# Patient Record
Sex: Male | Born: 1940 | Race: White | Hispanic: No | Smoking: Current every day smoker
Health system: Southern US, Community
[De-identification: ages and names within clinical notes are randomized; demographics above are authoritative.]

## PROBLEM LIST (undated history)

## (undated) DIAGNOSIS — E785 Hyperlipidemia, unspecified: Secondary | ICD-10-CM

## (undated) DIAGNOSIS — F1021 Alcohol dependence, in remission: Secondary | ICD-10-CM

## (undated) DIAGNOSIS — I639 Cerebral infarction, unspecified: Secondary | ICD-10-CM

## (undated) DIAGNOSIS — F32A Depression, unspecified: Secondary | ICD-10-CM

## (undated) DIAGNOSIS — N400 Enlarged prostate without lower urinary tract symptoms: Secondary | ICD-10-CM

## (undated) DIAGNOSIS — Z9289 Personal history of other medical treatment: Secondary | ICD-10-CM

## (undated) DIAGNOSIS — G119 Hereditary ataxia, unspecified: Secondary | ICD-10-CM

## (undated) DIAGNOSIS — R011 Cardiac murmur, unspecified: Secondary | ICD-10-CM

## (undated) DIAGNOSIS — M79671 Pain in right foot: Secondary | ICD-10-CM

## (undated) DIAGNOSIS — R269 Unspecified abnormalities of gait and mobility: Secondary | ICD-10-CM

## (undated) DIAGNOSIS — E1140 Type 2 diabetes mellitus with diabetic neuropathy, unspecified: Secondary | ICD-10-CM

## (undated) DIAGNOSIS — W19XXXA Unspecified fall, initial encounter: Secondary | ICD-10-CM

## (undated) DIAGNOSIS — I679 Cerebrovascular disease, unspecified: Secondary | ICD-10-CM

## (undated) DIAGNOSIS — F0390 Unspecified dementia without behavioral disturbance: Secondary | ICD-10-CM

## (undated) DIAGNOSIS — R32 Unspecified urinary incontinence: Secondary | ICD-10-CM

## (undated) DIAGNOSIS — F319 Bipolar disorder, unspecified: Secondary | ICD-10-CM

## (undated) DIAGNOSIS — Z8669 Personal history of other diseases of the nervous system and sense organs: Secondary | ICD-10-CM

## (undated) DIAGNOSIS — I10 Essential (primary) hypertension: Secondary | ICD-10-CM

## (undated) DIAGNOSIS — F329 Major depressive disorder, single episode, unspecified: Secondary | ICD-10-CM

## (undated) DIAGNOSIS — F1011 Alcohol abuse, in remission: Secondary | ICD-10-CM

## (undated) DIAGNOSIS — R296 Repeated falls: Secondary | ICD-10-CM

## (undated) DIAGNOSIS — F17200 Nicotine dependence, unspecified, uncomplicated: Secondary | ICD-10-CM

## (undated) DIAGNOSIS — R9089 Other abnormal findings on diagnostic imaging of central nervous system: Secondary | ICD-10-CM

## (undated) DIAGNOSIS — M79672 Pain in left foot: Secondary | ICD-10-CM

## (undated) DIAGNOSIS — R413 Other amnesia: Secondary | ICD-10-CM

## (undated) DIAGNOSIS — D649 Anemia, unspecified: Secondary | ICD-10-CM

## (undated) HISTORY — PX: HERNIA REPAIR: SHX51

## (undated) HISTORY — DX: Benign prostatic hyperplasia without lower urinary tract symptoms: N40.0

## (undated) HISTORY — DX: Cerebrovascular disease, unspecified: I67.9

## (undated) HISTORY — DX: Pain in left foot: M79.672

## (undated) HISTORY — DX: Unspecified abnormalities of gait and mobility: R26.9

## (undated) HISTORY — DX: Unspecified dementia without behavioral disturbance: F03.90

## (undated) HISTORY — DX: Alcohol dependence, in remission: F10.21

## (undated) HISTORY — DX: Other abnormal findings on diagnostic imaging of central nervous system: R90.89

## (undated) HISTORY — DX: Personal history of other medical treatment: Z92.89

## (undated) HISTORY — DX: Anemia, unspecified: D64.9

## (undated) HISTORY — DX: Personal history of other diseases of the nervous system and sense organs: Z86.69

## (undated) HISTORY — DX: Major depressive disorder, single episode, unspecified: F32.9

## (undated) HISTORY — DX: Hereditary ataxia, unspecified: G11.9

## (undated) HISTORY — DX: Unspecified urinary incontinence: R32

## (undated) HISTORY — DX: Alcohol abuse, in remission: F10.11

## (undated) HISTORY — DX: Depression, unspecified: F32.A

## (undated) HISTORY — DX: Cerebral infarction, unspecified: I63.9

## (undated) HISTORY — DX: Essential (primary) hypertension: I10

## (undated) HISTORY — DX: Hyperlipidemia, unspecified: E78.5

## (undated) HISTORY — DX: Cardiac murmur, unspecified: R01.1

## (undated) HISTORY — DX: Other amnesia: R41.3

## (undated) HISTORY — DX: Type 2 diabetes mellitus with diabetic neuropathy, unspecified: E11.40

## (undated) HISTORY — DX: Pain in right foot: M79.671

## (undated) HISTORY — DX: Repeated falls: R29.6

## (undated) HISTORY — DX: Nicotine dependence, unspecified, uncomplicated: F17.200

## (undated) HISTORY — PX: TONSILLECTOMY: SUR1361

## (undated) HISTORY — DX: Unspecified fall, initial encounter: W19.XXXA

## (undated) HISTORY — DX: Bipolar disorder, unspecified: F31.9

---

## 2008-05-31 ENCOUNTER — Inpatient Hospital Stay (HOSPITAL_COMMUNITY): Admission: EM | Admit: 2008-05-31 | Discharge: 2008-06-06 | Payer: Self-pay | Admitting: Emergency Medicine

## 2008-05-31 ENCOUNTER — Ambulatory Visit: Payer: Self-pay | Admitting: Cardiology

## 2008-06-03 ENCOUNTER — Encounter (INDEPENDENT_AMBULATORY_CARE_PROVIDER_SITE_OTHER): Payer: Self-pay | Admitting: Internal Medicine

## 2008-06-03 ENCOUNTER — Ambulatory Visit: Payer: Self-pay | Admitting: *Deleted

## 2008-06-03 DIAGNOSIS — Z9289 Personal history of other medical treatment: Secondary | ICD-10-CM

## 2008-06-03 HISTORY — DX: Personal history of other medical treatment: Z92.89

## 2008-06-04 ENCOUNTER — Ambulatory Visit: Payer: Self-pay | Admitting: Physical Medicine & Rehabilitation

## 2008-06-06 ENCOUNTER — Observation Stay (HOSPITAL_COMMUNITY): Admission: EM | Admit: 2008-06-06 | Discharge: 2008-06-10 | Payer: Self-pay | Admitting: Emergency Medicine

## 2011-04-09 ENCOUNTER — Ambulatory Visit (INDEPENDENT_AMBULATORY_CARE_PROVIDER_SITE_OTHER): Payer: Medicare Other | Admitting: Family Medicine

## 2011-04-09 DIAGNOSIS — R3915 Urgency of urination: Secondary | ICD-10-CM

## 2011-04-09 DIAGNOSIS — N400 Enlarged prostate without lower urinary tract symptoms: Secondary | ICD-10-CM

## 2011-04-09 DIAGNOSIS — R32 Unspecified urinary incontinence: Secondary | ICD-10-CM

## 2011-04-09 DIAGNOSIS — E119 Type 2 diabetes mellitus without complications: Secondary | ICD-10-CM

## 2011-05-04 NOTE — Discharge Summary (Signed)
NAMEAREON, COCUZZA NO.:  [id number]   MEDICAL RECORD NO.:  [id number]          PATIENT TYPE:  INP   LOCATION:  3028                         FACILITY:  [facility]   PHYSICIAN:  Elliot Cousin, M.D.    DATE OF BIRTH:  [birth year]   DATE OF ADMISSION:  05/31/2008  DATE OF DISCHARGE:  06/06/2008                               DISCHARGE SUMMARY   ADDENDUM:  Please see the previous discharge summaries dictated by Dr. Hillery Aldo and Dr. Lonia Blood.   AMENDED/ADDENDUM DISCHARGE DIAGNOSES:  1. Homelessness.  Over the past 48 hours, the major issue has been      disposition.  Apparently, the patient has adamantly refused      assisted living facility and/or skilled nursing facility placement.      The patient's sister was approached with the possibility that he      could come home with her.  She declined and refused to accept him      into her home.  Therefore, the clinical social worker was consulted      for further recommendations.  In the interim, Dr. Jeanie Sewer,      psychiatrist, was consulted to assess the patient's capacity to      make his own decisions.  Dr. Jeanie Sewer evaluated the patient today,      and he clearly stated that the patient had capacity to make an      informed decision.  The patient is currently open and receptive to      being discharged to a homeless shelter.  Therefore, the patient      will be discharged to [facility] today after the clinical social      worker confirmed that the [facility] has beds available.  The      patient is currently alert and oriented and stable.  2. ANXIETY DISORDER, not otherwise specified/rule out delirium, not      otherwise specified.  As indicated above, psychiatrist Dr.      Jeanie Sewer was consulted to assess the patient for his ability to      make informed decisions.  The patient has a history of depression      and anxiety.  He had been previously treated with Seroquel and      amitriptyline.   Several days ago, the Seroquel and amitriptyline      were discontinued.  Dr. Jeanie Sewer agreed with keeping the patient      off of therapy with Elavil and Seroquel.  He felt that the      patient's long term memory may improve off of the amitriptyline.      He recommended that the patient follow up with psychiatry and/or      neurology at the [facility] if needed.  Also if needed, the patient could      be referred to the outpatient clinics at [facility],      [facility], or [facility].  From a psychiatric      standpoint, the patient is currently stable and has no acute needs.   DISCHARGE  MEDICATIONS:  1. Aspirin 325 mg daily.  2. Plavix 75 mg daily.  3. Metformin 1000 mg b.i.d.  4. Lisinopril 2.5 mg daily.  5. Simvastatin 40 mg daily.  6. Amlodipine 10 mg daily.  7. Omeprazole 20 mg daily.  8. Multivitamin once daily.  9. Galantamine 4 mg b.i.d.  10.Stop amitriptyline and Seroquel.   DISCHARGE DISPOSITION:  The patient was advised to follow up with his  regular physician at the [facility].  The patient was also advised to  call Dr. Bud Face for a follow-up appointment for reassessment of  the carotid artery stenosis.  The patient voiced understanding.   CONSULTATIONS:  Antonietta Breach, MD.      Elliot Cousin, M.D.  Electronically Signed     DF/MEDQ  D:  06/06/2008  T:  06/06/2008  Job:  [id number]

## 2011-05-04 NOTE — H&P (Signed)
[name], Austin NO.:  [name]   MEDICAL RECORD NO.:  [name]          PATIENT TYPE:  OBV   LOCATION:  5511                         FACILITY:  [facility]   PHYSICIAN:  Della Goo, M.D. DATE OF BIRTH:  04/06/1941   DATE OF ADMISSION:  06/06/2008  DATE OF DISCHARGE:                              HISTORY & PHYSICAL   NOTE:  This is a readmission.  The patient was discharged on June 06, 2008 and readmitted June 06, 2008.  Please refer to the previous history  and physical.  The patient was previously admitted on May 31, 2008 for  left-sided weakness and was evaluated.  He also had confusion at that  time.  During that hospitalization the patient was evaluated, had a  psychiatric evaluation and medications, which were adjusted.  He was  deemed competent to make his own decisions; however, there were issues  with placement of this patient.  The patient initially declined to be  placed in an assisted living facility and declined to be placed in a  homeless shelter; and, finally agreed to be discharged to the [facility].   The patient receives his medical care at the [facility].  He  also had previously been living with his sister, who lives in the area;  however, she refused to take him back into her home.  Apparently there  was a misunderstanding with the patient's placement at the [facility]  because at the [facility] had no beds available; and, the patient  returned to the emergency department.  The patient is without any  complaints.   PAST MEDICAL HISTORY:  The patient has a past medical history  significant for;  1. Hypertension.  2. Diabetes mellitus.  3. Gastroesophageal reflux disease.  4. Dyslipidemia.  5. Dementia.  6. Anxiety and depression.   MEDICATIONS:  The patient's medications include:  1. Lisinopril 2.5 mg 1 by mouth every day.  2. Aspirin 81 mg 1 by mouth every day.  3. Amlodipine 10 mg 1 by mouth every day.  4.  Metformin 1,000 mg 1 by mouth twice a day.  5. Galantine 4 mg 1 by mouth twice a day.  6. Thiamine 100 mg 1 by mouth every day.  7. Aspirin 325 mg 1 by mouth every day.  8. Plavix 75 mg 1 by mouth every day.  9. Omeprazole 20 mg 1 by mouth every day.  10.Multivitamin 1 by mouth every day.  11.The amitriptyline and Seroquel were discontinued per psychiatry.      These medications were discontinued secondary to problems with the      patient's long-term memory.   ALLERGIES:  No known drug allergies.   SOCIAL HISTORY:  The patient is a smoker and smokes a half-pack per day  and he has a past history of alcohol abuse.  He denies any IV drug  abuse.   FAMILY HISTORY:  The family history is positive for diabetes and  hypertension.   REVIEW OF SYSTEMS:  On review of systems the pertinents mentioned above.   PHYSICAL EXAMINATION:  GENERAL APPEARANCE:  This  is a 70 year old well-  nourished, well-developed male in no acute distress.  VITAL SIGNS:  His vital signs are temperature 98.2, blood pressure  129/71, heart rate 65, respirations 20, and his O2 saturation is 96% on  room air.  HEENT: Normocephalic and atraumatic.  Pupils are equally round and react  to light.  Extraocular muscles are intact.  Oropharynx is  clear.  NECK:  The neck is supple with full range of motion.  No thyromegaly,  adenopathy or jugular venous distention.  HEART:  Cardiovascular reveals a regular rate and rhythm.  No murmurs,  gallops or rubs.  LUNGS:  The lungs are clear to auscultation bilaterally.  ABDOMEN:  The abdomen has positive bowel sounds.  It is soft, nontender  and nondistended.  EXTREMITIES:  The extremities are without cyanosis, clubbing or edema.  NEUROLOGIC:  The neurological examination is nonfocal.   ASSESSMENT:  This is a 70 year old male being readmitted secondary to:  1. Dementia.  2. Homelessness.  3. Type 2 diabetes mellitus.  4. Hypertension.  5. Hyperlipidemia.   PLAN:  1. The  patient will be admitted for 23-hour observation.  2. He will resume his previous medications.  3. He will be monitored for neurologic changes.  4. Case management will be consulted for placement options.  5. DVT and GI prophylaxes have been ordered.      Della Goo, M.D.  Electronically Signed     HJ/MEDQ  D:  06/07/2008  T:  06/07/2008  Job:  [id number]

## 2011-05-04 NOTE — Discharge Summary (Signed)
[name], [name] NO.:  [id number]   MEDICAL RECORD NO.:  [id number]          PATIENT TYPE:  INP   LOCATION:  3028                         FACILITY:  [facility]   PHYSICIAN:  Hillery Aldo, M.D.   DATE OF BIRTH:  09/19/1941   DATE OF ADMISSION:  05/31/2008  DATE OF DISCHARGE:  06/04/2008                               DISCHARGE SUMMARY   ADDENDUM   PRIMARY CARE PHYSICIAN:  [place].  Unassigned.   For complete list of the discharge diagnoses, consultations, procedures,  diagnostic studies, and hospital course through June 03, 2008, please  see the previously dictated discharge summary done by Dr. Sharon Seller.   FINAL DISCHARGE DIAGNOSES:  1. Acute right internal capsule cerebrovascular accident.  2. Right distal internal carotid artery stenosis.   DISCHARGE MEDICATIONS:  1. Lisinopril 2.5 mg daily.  2. Aspirin 325 mg daily.  3. Amitriptyline 50 mg at bedtime.  4. Simvastatin 40 mg daily.  5. Amlodipine 10 mg daily.  6. Omeprazole 20 mg daily.  7. Metformin 1000 mg b.i.d.  8. Galantamine 4 mg b.i.d.  9. Plavix 75 mg daily.  10.Seroquel at previously prescribed dosage.   REMAINDER OF HOSPITAL COURSE:  The patient has remained medically stable  and is adamantly refusing placement at a assisted or skilled nursing  facility.  He is not a candidate for inpatient comprehensive  rehabilitation.  The most optimal situation for him would be 24 hours  supervision at home, but it is unclear if he will have such supervision.  Nevertheless, the patient is refusing placement at this time.  We will  send him home with home health physical therapy, occupational therapy,  and durable medical equipment including a rolling walker.  He plans to  return to his sister's home.  He should follow up with his primary care  physician in 1 week's time.  He should follow up with Dr. Jake [name] of  vascular surgery as an outpatient.  He was evaluated in the hospital and  recommendations were for intensive medical therapy and for followup in  the outpatient setting.      Hillery Aldo, M.D.  Electronically Signed    CR/MEDQ  D:  06/04/2008  T:  06/05/2008  Job:  [id number]

## 2011-05-04 NOTE — Consult Note (Signed)
NAMEGAD, AYMOND NO.:  [id number]   MEDICAL RECORD NO.:  [id number]          PATIENT TYPE:  INP   LOCATION:  3028                         FACILITY:  [facility]   PHYSICIAN:  Antonietta Breach, M.D.  DATE OF BIRTH:  1941/11/09   DATE OF CONSULTATION:  06/06/2008  DATE OF DISCHARGE:                                 CONSULTATION   REQUESTING PHYSICIAN:  [facility].   REASON FOR CONSULTATION:  Mental status changes, assess capacity.   HISTORY OF PRESENT ILLNESS:  Mr. Hansen is a 70 year old male  admitted to the [facility] on May 31, 2008 due to a left-sided  CVA.   The patient was noted to have a history of depression and anxiety in  review of the past medical record.  However, at this time the patient  has mild decreased energy but no other depressive symptoms.  He does  have some slight worry, but it does appear to be realistic regarding his  rehabilitation anticipation.   Mr. Attia was on Elavil 50 mg nightly prior to admission.  However,  this appears to have been an anti-pain dosage.   There was some mention of dementia in the past medical record as well.  However, the patient at this time does not meet any criteria for  dementia.  It may have been that he was having Elavil side effects.   Mr. Zeringue is socially appropriate and cooperative with staff.  He is  well aware of his general medical problems and how he came to be in the  hospital.  He is also aware of his self-care needs as well as typical  emergency signs and symptoms that would require him to call EMS.   He describes normal interests as well as constructive hope for the  future.  He states that he has been interested in fishing but has not  gone in a number of years.  He enjoys music.  He is not having any  hallucinations or delusions.  He has no thoughts of harming himself or  others.  He is socially appropriate and cooperative.   PAST PSYCHIATRIC HISTORY:  As  mentioned, there was a note in the past  medical record of depression and anxiety.  However, the patient denies  depression.  He was on Elavil 50 mg nightly in the past.  Also, he was  on Seroquel 100 mg nightly.  This may have been for a sundowning.   He does have a history in the past medical record of anxiety, noted.   FAMILY PSYCHIATRIC HISTORY:  None known.   SOCIAL HISTORY:  The patient has been living with his sister.  He does  not use alcohol.  He does not use illegal drugs.  He is retired and is a  Cytogeneticist.   PAST MEDICAL HISTORY:  Diabetes mellitus, hypertension, gastroesophageal  reflux disease, acute CVA.   No known drug allergies.   MEDICATIONS:  The MAR is reviewed.  The patient is not on any  psychotropic agents.  The Elavil has been discontinued.   LABORATORY DATA:  Sodium 133, BUN 11, creatinine  0.95, SGOT 18, SGPT 12,  WBC 7.9, hemoglobin 10.7, platelet count 182,000.   REVIEW OF SYSTEMS:  Constitutional, head, eyes, ears, nose, throat,  mouth, neurologic, psychiatric cardiovascular, respiratory,  gastrointestinal, genitourinary, skin, musculoskeletal, hematologic  lymphatic, endocrine, metabolic all unremarkable.   EXAMINATION:  VITAL SIGNS:  Temperature 97.8, pulse 67, respiratory rate  18, blood pressure 111/71, O2 saturation on room air 93%.  GENERAL APPEARANCE:  Mr. Vanallen is an elderly male lying in his bed  in a supine position.  He does appear to be approximately 10 years  younger than his chronologic age.  He has no abnormal involuntary movements.  MENTAL STATUS EXAM:  Mr. Woodford is alert.  His eye contact is good.  His attention span is within normal limits.  His concentration is within  normal limits.  Memory function is excellent with 3/3 immediate and 3/3  on recall.  His fund of knowledge and intelligence are within normal  limits.  His speech involves normal rate and prosody without dysarthria.  His affect is slightly anxious at baseline  but with a broad appropriate  response.  His mood is within normal limits.  Thought process logical,  coherent, goal-directed.  No looseness of associations or thought  content.  No thoughts of harming himself.  No thoughts of harming  others.  No delusions.  No hallucinations.  Insight is good.  The  patient clearly understands what brought him to the hospital.  Judgment  is intact.   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  Rule out 293.00 delirium not otherwise specified.  The patient may have  had partial symptoms of decreased memory and some cognitive disability.  He may have even had some sundowning.  However, now that the Elavil has  been discontinued, noting that it has significant anticholinergic side  effects, the patient's long-term memory and cognitive status does need  to be monitored to confirm the psychotropic regimen that will be  required over the long-term.  AXIS II:  None.  AXIS III:  See past medical history.  AXIS IV:  General medical.  AXIS V:  55.   Mr. Arnone can make a consistent choice.  He can differentiate  between his options and their associated risks versus benefits.  He does  appreciate his general medical problems and their potential morbidity  and mortality risks.  He also can reason well.   Please see the AXIS I discussion above.  Mr. Menter   does have the capacity for informed consent and self care.   RECOMMENDATIONS:  1. Concur with the discontinuation of Elavil.  Please see the      discussion above.  The patient does not need any acute      psychotropics.  2. However, would have the patient follow up with outpatient      psychiatry or neurology for monitoring his      memory ability.  Now that he is off the Elavil, he has a greater      chance of long-term memory function.  3. Psychiatric followup can be arranged if any depression or      significant anxiety emerges.  Outpatient clinics are available  at      [facility], [facility], or [facility].      Antonietta Breach, M.D.  Electronically Signed     JW/MEDQ  D:  06/06/2008  T:  06/06/2008  Job:  [id number]

## 2011-05-04 NOTE — Discharge Summary (Signed)
NAMEDUFF, POZZI NO.:  [id number]   MEDICAL RECORD NO.:  [id number]          PATIENT TYPE:  INP   LOCATION:  3028                         FACILITY:  [facility]   PHYSICIAN:  Lonia Blood, M.D.DATE OF BIRTH:  March 20, 1941   DATE OF ADMISSION:  05/31/2008  DATE OF DISCHARGE:  06/03/2008                               DISCHARGE SUMMARY   PRIMARY CARE PHYSICIAN:  [place].  Unassigned.   DISCHARGE DIAGNOSES:  1. Acute right internal capsule CVA.      a.     CT scan suggesting small vessel disease.      b.     MRA unremarkable.      c.     Carotid Dopplers suggesting 40-60% distal ICA stenosis.      d.     Vascular surgery consultation pending for consideration of       carotid endarterectomy  2. Hopeful for inpatient rehabilitation.  3. Transthoracic echocardiogram pending.  4. Uncontrolled diabetes mellitus, currently titrating medications.  5. Hypertension.  Medication adjustment ongoing  6. Occasional PVCs.  Echo pending.  7. Tobacco abuse.  Tobacco cessation consultation pending.  8. Hyperlipidemia.  Successfully treated with current therapy.  9. Normocytic anemia.  Hemoglobin stable at approximate 10.5 with no      evidence of severe iron deficiency, but outpatient screening      colonoscopy recommended.  10.Mild dementia.  Full medication initiated.   DISCHARGE MEDICATIONS:  The exact regimen that will be used will be  determined at the actual time of discharge.   CONSULTATIONS:  1. Vascular surgery consultation currently pending.  2. Inpatient rehab consultation currently pending.   PROCEDURES:  1. CT scan of the head May 31, 2008, atrophy with small-vessel      chronic ischemic change of the deep cerebral white matter.  No      acute intracranial abnormalities.  2. MRI of the brain on June 01, 2008.  Small acute infarct, posterior      limb internal capsule on the right.  Generalized atrophy and      chronic ischemic change.  3. MRA of the  head June 13,009, negative.  4. Bilateral carotid Dopplers, 40-60% right ICA distal stenosis.      Vertebral flow antegrade.  Left no hemodynamically significant ICA      stenosis.  5. Transthoracic echocardiogram, currently pending   FOLLOW UP:  To be determined at actual time of discharge.   HOSPITAL COURSE:  Mr. Keyron Pokorski is a very pleasant 70 year old  gentleman who received most of his care at the [facility].  He has no  local physician as a result.  He presented to the hospital on May 31, 2008, with acute onset of left-sided weakness.  This was associated with  gait instability.  Upon presentation to the hospital, there was concern  the patient had likely suffered a right brain stroke.  CT scan was  carried out and failed to reveal any evidence of a hemorrhagic stroke or  intracranial mass.  The patient was admitted to the acute units.  Full  evaluation was  initiated.  The patient passed a stroke swallow screen  was able to tolerate a regular diabetic diet without difficulty.  MRI  and MRA of the head were then obtained.  MRA was unrevealing.  The MRI  did in fact confirm the presence of a posterior right internal capsule  acute CVA.  This was in agreement with the patient's clinical symptoms  of left upper extremity weakness and left lower extremity weakness and  generalized clumsiness of use of these extremities.  Transthoracic  echocardiogram was accomplished and is currently pending.  Bilateral  carotid Dopplers were accomplished and today have revealed a 40-60%  right-sided ICA stenosis.  In that this should be considered a  symptomatic ICA stenosis.  There is possible benefit to be gleaned from  carotid endarterectomy.  As a result, the vascular surgery will be asked  to evaluate the patient and consider proceeding with this procedure at  their discretion.  Additionally, inpatient rehab is being consulted to  attempt to provide the patient with balance retraining  and increase in  his strength.  It is felt that his rehab potential is not quite high.   Secondary stroke risk prevention was carried out with strict control of  blood pressure and diabetes.  The patient's blood pressure is currently  actually somewhat overly controlled on mild blood pressure medicine and  treatment alone, and therefore we are backing off to assure that he has  adequate cerebral perfusion.  CBG, however, had proven to be poorly  controlled, and ongoing titration of medications is currently being  carried out.  The patient does have a history of hyperlipidemia, but his  LDL is 40 on his current Zocor therapy, and this was therefore  continued.  Of note, the patient was also found to be suffering with a  normocytic anemia.  Iron studies were not suggestive of severe iron  deficiency.  Stool guaiacs have been ordered but are still pending.  At  the present time, inpatient evaluation is not felt to be necessary, but  consideration should be given to outpatient routine screening  colonoscopy simply based upon the patient's age.   At the present time, the patient is clinically stable.  We are  continuing with rehab and hoping for an ultimate stay within inpatient  rehab unit.  Vascular Surgery is also being asked to evaluate the  patient for possible carotid endarterectomy.      Lonia Blood, M.D.  Electronically Signed     JTM/MEDQ  D:  06/03/2008  T:  06/03/2008  Job:  [id number]

## 2011-05-04 NOTE — H&P (Signed)
NAMESOSTENES, [name] NO.:  [id number]   MEDICAL RECORD NO.:  [id number]          PATIENT TYPE:  INP   LOCATION:  3039                         FACILITY:  [facility]   PHYSICIAN:  Lonia Blood, M.D.      DATE OF BIRTH:  12-01-1941   DATE OF ADMISSION:  05/31/2008  DATE OF DISCHARGE:                              HISTORY & PHYSICAL   PRIMARY CARE PHYSICIAN:  The patient goes to [facility], hence he is on  the [place].   PRESENTING COMPLAINT:  Left-sided weakness.   HISTORY OF PRESENT ILLNESS:  The patient is a 70 year old gentleman with  history of diabetes and hypertension among other things who apparently  woke up today around 1 o'clock with left-sided weakness.  He was having  numbness in his upper extremities and lower extremities.  He was unable  to put enough weight on it due to weakness.  Denied any fever, nausea,  vomiting, or diarrhea.  The patient was brought in by his sister for  further management.  He has been taking his medications consistently  without any problems.  He was just seen at [facility] [place] 2  days ago, where he had a CT scan of the abdomen and pelvis and at that  time, came back as normal.  He denied any other focal weakness.  No  problem with his speech.  No problem with his swallowing.   PAST MEDICAL HISTORY:  His past medical history is significant for  diabetes, hypertension, gastroesophageal reflux disease, dyslipidemia,  dementia, anxiety, and depression.   ALLERGIES:  He has no known drug allergies.   MEDICATIONS:  Include lisinopril 2.5 mg daily, aspirin 81 mg daily,  amitriptyline 50 mg daily at night, simvastatin 40 mg daily, amlodipine  10 mg daily, omeprazole 20 mg daily, metformin 500 mg b.i.d., Seroquel  100 mg daily, Galantamine oral 4 mg 2 tablets started about 2 days ago,  vitamin B1 as needed, and vitamin D also as needed.   SOCIAL HISTORY:  The patient currently lives with his sister.  He smokes  about half a  pack per day.  He has not drank alcohol in a year and half.  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   FAMILY HISTORY:  Significant for diabetes and hypertension.   REVIEW OF SYSTEMS:  A 12-point review of systems is negative except by  HPI.   PHYSICAL EXAMINATION:  VITAL SIGNS:  Temperature is 98.3, blood pressure  135/75, pulse 77, respiratory rate 18, and sat 97% on room air.  GENERAL:  He is awake, alert, oriented man in no acute distress.  Able  to carry on conversation effectively.  HEENT:  PERRL.  EOMI.  NECK:  Supple.  No JVD.  No lymphadenopathy.  RESPIRATORY:  He has good air entry bilaterally.  No wheezes or rales.  CARDIOVASCULAR:  The patient has S1 and S2 normal.  ABDOMEN:  Soft and nontender with positive bowel sounds.  EXTREMITIES:  No edema, cyanosis, or clubbing.  NEURO:  Cranial nerves II through XII seem to be intact.  His power  is  5/5 both upper and lower extremities respectively bilaterally.  Reflex  is 2+.   LABORATORY DATA:  White count is 10.5, hemoglobin 11.4, platelet count  219 with MCV of 87.8 and normal differentials.  Cardiac enzymes  essentially negative.  PT 14.1 and INR 1.1.  Sodium 137, potassium 4.1,  chloride 106, CO2 of 22, glucose 150, BUN 13, creatinine 1.22, calcium  9.8, total protein 7.3, and albumin 4.1.   Chest x-ray showed no active cardiopulmonary disease.  Head CT without  contrast showed atrophy with small vessel chronic ischemic changes.  No  acute intracranial abnormalities.   ASSESSMENT:  This is a 70 year old gentleman presenting with left-sided  weakness most likely cerebrovascular accident.  The patient has all the  risk factors for cerebrovascular accident.  His head CT is negative, so  this may be embolic in nature.   PLAN:  1. Acute CVA.  We will admit the patient to a monitored bed especially      because his EKG showed some abnormal findings including sinus      rhythm with a rate of 85 by multiple premature  atrial complexes and      bifascicular block.  With these, we will check serial cardiac      enzymes, carotid Dopplers, 2D echocardiogram,  B12, RPR level,      check homocysteine level, and fasting lipid panel etc.  If we find      any significant risk factor, we would tackle it and modify it.  At      this point, the patient has came in more than 3 hours after the      onset of his symptoms, so he is outside the window for TPA.  We,      however, gave him some aspirin rectally, keep him n.p.o. until he      passes swallow evaluation.  2. Diabetes.  I will put the patient on sliding scale insulin while he      is n.p.o. at this point and if needed, we will use long-acting      insulin once he starts eating.  3. Hypertension.  Blood pressure is okay at this point and will not      treat it.  4. Tobacco abuse.  I will put him on nicotine patch as needed.  5. Dyslipidemia.  The patient has been taking statins at home.  We      will check fasting lipid panel at this point.  6. GERD.  I will continue PPI, remain IV at this point.  Further      treatment will depend on the patient's response to these initial      measures in the hospital.      Lonia Blood, M.D.  Electronically Signed     LG/MEDQ  D:  05/31/2008  T:  06/01/2008  Job:  [id number]

## 2011-05-07 NOTE — Discharge Summary (Signed)
NAMECLEMENS, LACHMAN NO.:  [id number]   MEDICAL RECORD NO.:  [id number]          PATIENT TYPE:  OBV   LOCATION:  5511                         FACILITY:  [facility]   PHYSICIAN:  Isidor Holts, M.D.  DATE OF BIRTH:  1941/03/14   DATE OF ADMISSION:  06/06/2008  DATE OF DISCHARGE:  06/10/2008                               DISCHARGE SUMMARY   ADDENDUM:  For details of discharge diagnoses, consultation, procedures  and detailed clinical course, refer to interim discharge summaries  dictated June 03, 2008 by Dr. Jetty Duhamel, June 04, 2008 by Dr.  Hillery Aldo, and June 06, 2008 by Dr. Elliot Cousin.  The patient was  discharged on June 06, 2008 in the first instance, but re presented  later the same day.  For details of that readmission, refer to H&P notes  of June 07, 2008 dictated by Dr. Della Goo.   In brief summary, this patient was due to return to [facility], but  there were no beds available, and, therefore, the patient was returned  to the emergency department, and readmitted. For the period from June 07, 2008 to June 10, 2008,  there were no changes in the patient's  clinical condition.  Hypertension remained controlled, as did type 2  diabetes mellitus.  There were no problems referable to dementia and no  acute problems arose.  He was subsequently discharged to skilled nursing  facility, i.e., [facility] on June 10, 2008.   DISCHARGE MEDICATIONS:  These remained unchanged from that of discharge  summary of June 06, 2008 dictated by Dr. Elliot Cousin.      Isidor Holts, M.D.  Electronically Signed     CO/MEDQ  D:  06/24/2008  T:  06/24/2008  Job:  [id number]

## 2011-06-01 DIAGNOSIS — Z9289 Personal history of other medical treatment: Secondary | ICD-10-CM

## 2011-06-01 HISTORY — DX: Personal history of other medical treatment: Z92.89

## 2011-09-16 LAB — COMPREHENSIVE METABOLIC PANEL
ALT: 12
AST: 18
AST: 20
Albumin: 4.1
Albumin: 3.6
Albumin: 3.9
Alkaline Phosphatase: 65
BUN: 13
BUN: 19
CO2: 24
Calcium: 9.8
Calcium: 9.3
Chloride: 102
Creatinine, Ser: 1
GFR calc Af Amer: >60
GFR calc Af Amer: >60
GFR calc non Af Amer: >60
GFR calc non Af Amer: >60
Glucose, Bld: 150 — ABNORMAL HIGH
Potassium: 3.8
Sodium: 133 — ABNORMAL LOW
Total Bilirubin: 0.6
Total Bilirubin: 0.6
Total Protein: 7.3

## 2011-09-16 LAB — CBC
HCT: 32.8 — ABNORMAL LOW
HCT: 31.7 — ABNORMAL LOW
Hemoglobin: 11.4 — ABNORMAL LOW
MCHC: 34.9
MCHC: 34.9
MCV: 87.2
MCV: 87.2
Platelets: 219
Platelets: 182
Platelets: 187
RBC: 3.53 — ABNORMAL LOW
RDW: 12.2
WBC: 7.9

## 2011-09-16 LAB — APTT: aPTT: 33

## 2011-09-16 LAB — DIFFERENTIAL
Lymphocytes Relative: 23
Lymphs Abs: 2.4
Monocytes Absolute: 0.8
Monocytes Relative: 7
Neutro Abs: 6.9
Neutrophils Relative %: 66

## 2011-09-16 LAB — TROPONIN I
Troponin I: 0.01
Troponin I: 0.01

## 2011-09-16 LAB — RETICULOCYTES
RBC.: 3.79 — ABNORMAL LOW
Retic Count, Absolute: 41.7
Retic Ct Pct: 1.1

## 2011-09-16 LAB — PROTIME-INR: INR: 1.1

## 2011-09-16 LAB — LIPID PANEL
LDL Cholesterol: 40
Triglycerides: 190 — ABNORMAL HIGH
VLDL: 38

## 2011-09-16 LAB — CK TOTAL AND CKMB (NOT AT ARMC)
CK, MB: 3.2
Relative Index: 3.4 — ABNORMAL HIGH
Relative Index: INVALID

## 2011-09-16 LAB — URINALYSIS, ROUTINE W REFLEX MICROSCOPIC
Bilirubin Urine: NEGATIVE
Hgb urine dipstick: NEGATIVE
Ketones, ur: NEGATIVE
Protein, ur: NEGATIVE
Specific Gravity, Urine: 1.013
Urobilinogen, UA: 0.2

## 2011-09-16 LAB — HEMOGLOBIN A1C
Hgb A1c MFr Bld: 6.9 — ABNORMAL HIGH
Mean Plasma Glucose: 168

## 2011-09-16 LAB — IRON AND TIBC: Saturation Ratios: 18 — ABNORMAL LOW

## 2011-09-16 LAB — VITAMIN B12
Vitamin B-12: 582 (ref 211–911)
Vitamin B-12: 639 (ref 211–911)

## 2011-09-16 LAB — POCT CARDIAC MARKERS
CKMB, poc: 1.4
Myoglobin, poc: 72.3
Operator id: 272551

## 2012-07-12 ENCOUNTER — Encounter: Payer: Self-pay | Admitting: Medical

## 2012-07-12 ENCOUNTER — Ambulatory Visit (INDEPENDENT_AMBULATORY_CARE_PROVIDER_SITE_OTHER): Payer: Medicare Other | Admitting: Medical

## 2012-07-12 VITALS — BP 116/64 | HR 80 | Temp 97.5°F | Resp 16

## 2012-07-12 DIAGNOSIS — M25519 Pain in unspecified shoulder: Secondary | ICD-10-CM

## 2012-07-12 DIAGNOSIS — W19XXXA Unspecified fall, initial encounter: Secondary | ICD-10-CM

## 2012-07-12 DIAGNOSIS — E119 Type 2 diabetes mellitus without complications: Secondary | ICD-10-CM

## 2012-07-12 DIAGNOSIS — G119 Hereditary ataxia, unspecified: Secondary | ICD-10-CM

## 2012-07-12 DIAGNOSIS — M25512 Pain in left shoulder: Secondary | ICD-10-CM

## 2012-07-12 MED ORDER — TRAMADOL HCL 50 MG PO TABS: 50.0000 mg | ORAL_TABLET | Freq: Three times a day (TID) | ORAL | Status: AC | PRN

## 2012-07-12 NOTE — Progress Notes (Signed)
Subjective: Here with sister.   Usually goes to the [facility] in [place] for routine care on his chronic issues.   Not scheduled back there for 16mo.  Here for left shoulder pain for several days.  Hurts to raise arm up over head or out to the side.  Arm fine at rest, no night time pain.  He reports 3-4 days of left shoulder pain that radiates down the upper arm.  Using the arm worsens, can barely pick the arm up.  He denies similar issue before.   Using Tylenol 500mg  BID.     This morning he rolled off the bed.  Sister thinks he rolled out of the bed asleep.  He notes that when he went to get up out of bed, legs felt weak and he fell on his belly side. He denies head injury, LOC.   He did land on the left side and left arm which was already hurting.  Denies other falls.  He uses cane around the house.  Has walker but won't use it.  He stays at home in his room other than visits to doctors or once weekly out to eat with family.  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    Sister wants handicap sticker signed. She takes care of Mr. Hyams and they drive him around, so having closer parking space would be helpful.   Objective: Gen: wd, wn, seated in wheel chair Psych: pleasant, answers questions appropriately, A&O Heart: RRR, normal S1, S2, no murmur Lungs: CTA MSK: tender left deltoid, pain with shoulder flexion and abduction above 80 degrees, negative neers, hawkins, empty can, negative sulcus sign, no apprehension, tender with crossover test.  No obvious achymosis or other deformity Neuro: cn2-12 intact, DTRs and sensation WNL, LE strength bilat 4-5/5, UE strength normal, no finger to nose abnormality, didn't do heel to toe, rhomberg, as he is unable to stand for very long today Back: nontender   Assessment: Encounter Diagnoses  Name Primary?  . Shoulder pain, left Yes  . Fall   . Cerebellar ataxia   . Type II or  unspecified type diabetes mellitus without mention of complication, not stated as uncontrolled      Plan: Shoulder pain - script for Ultram, rest, ice, and if not improving in 5-7 days, call or return.  Fall - discussed risks of falls, using cane, using his walker, avoid falls, avoiding clutter.  [facility]   signed handicap sticker form for sister Lorne Skeens since she helps take him to appointments and restaurants, etc.

## 2012-07-12 NOTE — Patient Instructions (Signed)
Alternate Tylenol 500mg  OTC with Ultram.  He can alternate these every 4-6 hours.  Use this for shoulder pain.

## 2012-07-18 ENCOUNTER — Telehealth: Payer: Self-pay | Admitting: Family Medicine

## 2012-07-18 NOTE — Telephone Encounter (Signed)
Message copied by Janeice Robinson on Tue Jul 18, 2012  4:07 PM ------      Message from: Jac Canavan      Created: Mon Jul 17, 2012  2:41 PM       Call and see how he is doing?  i had seen him (along with his sister) for shoulder pain, fall.  Check up on him please.

## 2012-07-18 NOTE — Telephone Encounter (Signed)
Patients sister said that she took him to the [place] yesterday. She said his shoulder is better. She has not had to give him any pain medication in the last 2 or 3 days. She thanks for calling and checking. CLS

## 2013-01-16 ENCOUNTER — Ambulatory Visit (INDEPENDENT_AMBULATORY_CARE_PROVIDER_SITE_OTHER): Payer: Medicare Other | Admitting: Medical

## 2013-01-16 ENCOUNTER — Encounter: Payer: Self-pay | Admitting: Medical

## 2013-01-16 VITALS — BP 132/68 | HR 88 | Temp 98.5°F | Resp 14

## 2013-01-16 DIAGNOSIS — Z7409 Other reduced mobility: Secondary | ICD-10-CM

## 2013-01-16 DIAGNOSIS — R29898 Other symptoms and signs involving the musculoskeletal system: Secondary | ICD-10-CM

## 2013-01-16 DIAGNOSIS — G589 Mononeuropathy, unspecified: Secondary | ICD-10-CM

## 2013-01-16 DIAGNOSIS — R279 Unspecified lack of coordination: Secondary | ICD-10-CM

## 2013-01-16 DIAGNOSIS — E119 Type 2 diabetes mellitus without complications: Secondary | ICD-10-CM

## 2013-01-16 DIAGNOSIS — R32 Unspecified urinary incontinence: Secondary | ICD-10-CM

## 2013-01-16 DIAGNOSIS — R1310 Dysphagia, unspecified: Secondary | ICD-10-CM

## 2013-01-16 DIAGNOSIS — G629 Polyneuropathy, unspecified: Secondary | ICD-10-CM

## 2013-01-16 DIAGNOSIS — R270 Ataxia, unspecified: Secondary | ICD-10-CM

## 2013-01-16 LAB — CBC WITH DIFFERENTIAL/PLATELET
Basophils Relative: 0 % (ref 0–1)
HCT: 30.6 % — ABNORMAL LOW (ref 39.0–52.0)
Hemoglobin: 10.4 g/dL — ABNORMAL LOW (ref 13.0–17.0)
Lymphs Abs: 2.3 10*3/uL (ref 0.7–4.0)
MCH: 29.3 pg (ref 26.0–34.0)
MCHC: 34 g/dL (ref 30.0–36.0)
Monocytes Absolute: 0.8 10*3/uL (ref 0.1–1.0)
Monocytes Relative: 9 % (ref 3–12)
Neutro Abs: 5.9 10*3/uL (ref 1.7–7.7)
Neutrophils Relative %: 64 % (ref 43–77)
RBC: 3.55 MIL/uL — ABNORMAL LOW (ref 4.22–5.81)

## 2013-01-16 LAB — VITAMIN B12: Vitamin B-12: 1168 pg/mL — ABNORMAL HIGH (ref 211–911)

## 2013-01-16 LAB — COMPREHENSIVE METABOLIC PANEL
Albumin: 4.6 g/dL (ref 3.5–5.2)
BUN: 24 mg/dL — ABNORMAL HIGH (ref 6–23)
CO2: 25 mEq/L (ref 19–32)
Calcium: 9.9 mg/dL (ref 8.4–10.5)
Chloride: 105 mEq/L (ref 96–112)
Glucose, Bld: 119 mg/dL — ABNORMAL HIGH (ref 70–99)
Potassium: 4.6 mEq/L (ref 3.5–5.3)
Sodium: 140 mEq/L (ref 135–145)
Total Protein: 7.4 g/dL (ref 6.0–8.3)

## 2013-01-16 LAB — FOLATE: Folate: >20 ng/mL

## 2013-01-16 NOTE — Progress Notes (Signed)
Subjective: Here for evaluation.  Usually sees [place] [facility] in [place].   Here with sister today.  They have several concerns.   [facility].  He has issues with incontinence, on oxybutynin BID, but this doesn't seem to help much.  wearing pull ups all the time.   Sometimes he doesn't realize when he is wet.  Sister wants him checked for UTI.    He has issues with swallowing.  When eating, coughs a lot,feels like food doesn't go down right way.  Denies heartburn, no abdominal pain, some belching.  No vomiting, no food getting stuck in the throat.  Denies SOB, wheezing, dyspnea.   He does continues to smoke some.  Sister and other siblings that watch after him are concerned about him having dementia.  They are increasingly having to watch after him more.  He is not driving, doesn't cook, dresses himself with help, baths self.  However, sister manages his medication, has to remind him to bath and change  His underwear.   He tends to sleep a lot.  Sister does report that he has some short term memory loss, sometimes with confusion.  He has numbness in fingers, toes.   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    Has trouble walking and very limited in his mobility.  Unsteady on his feet.  He has hx/o stroke 6 years ago, had left sided weakness, mostly this resolved.   Sees psychiatry at [facility].    Past Medical History  Diagnosis Date  . Hypertension   . Hyperlipidemia   . Diabetes mellitus   . Tobacco use disorder   . Bipolar disorder   . BPH (benign prostatic hypertrophy)   . Cerebellar ataxia   . CVA (cerebral infarction)    ROS as in subjective   Objective:   Physical Exam  Filed Vitals:   01/16/13 1344  BP: 132/68  Pulse: 88  Temp: 98.5 F (36.9 C)  Resp: 14    General appearance: alert, no distress, WD/WN, seated in wheelchair Oral cavity: MMM, no lesions Neck: supple, no lymphadenopathy, no thyromegaly, no  masses Heart: brief 2/6 systolic murmur heard best in right upper sternal border, otherwise RRR, normal S1, S2 Lungs: CTA bilaterally, no wheezes, rhonchi, or rales Abdomen: +bs, soft, non tender, non distended, no masses, no hepatomegaly, no splenomegaly Extremities: no edema, no cyanosis, no clubbing Pulses: 1+ symmetric Neurological: alert, oriented x 3, left face with weakness on smiling, otherwise CN2-12 intact, strength normal upper extremities and lower extremities, sensation normal throughout, DTRs 1+ throughout, +difficulty walking and standing, having to hold on to tablet.  Unable to stand long enough to do romberg, heel to toes. Psychiatric: normal affect, behavior normal, pleasant    Assessment and Plan :    Encounter Diagnoses  Name Primary?  . Swallowing difficulty Yes  . Ataxia   . Neuropathy   . Incontinence   . Impaired mobility and ADLs   . Type II or unspecified type diabetes mellitus without mention of complication, not stated as uncontrolled    He was unable to give urine specimen.  Sister will try and bring back a morning urine specimen in the next few days.   We will check baseline labs. He is limited in ADLs, has to ambulate either in wheelchair or with cane.   At this point he doesn't seem to be safe to be home alone.  Sisters are considering options including  nursing home vs assisted living facility.  Will set up swallow study.  Refer to neurology for further evaluation regarding dementia.  MMSE today 21/30.  Follow-up pending labs.

## 2013-01-17 LAB — RPR

## 2013-01-17 LAB — HEMOGLOBIN A1C: Mean Plasma Glucose: 146 mg/dL — ABNORMAL HIGH (ref <117)

## 2013-01-18 ENCOUNTER — Telehealth: Payer: Self-pay | Admitting: Internal Medicine

## 2013-01-18 NOTE — Telephone Encounter (Signed)
Yes, we'll need his signature to get records. Vernona Rieger may be able to get the signature.

## 2013-01-18 NOTE — Telephone Encounter (Signed)
shane- when pt was here, he did not sign a release form for these records. And i will need to call the pt for him to come back in or send him a copy to sign for me to get all the records you want me to get on him. i did not know you wanted me to get records on him or i would have done at the time of the visit. Vernona Rieger had checked him out, and she may have not of known. Would you like me to call the pt to come back in to sign a release form

## 2013-01-18 NOTE — Telephone Encounter (Signed)
Message copied by Joslyn Hy on Thu Jan 18, 2013  3:08 PM ------      Message from: Jac Canavan      Created: Wed Jan 17, 2013  3:36 AM       Karren Burly - B12 level was elevated, and his labs show anemia, otherwise labs ok.  Lets move forward with setting up swallow study (can set up for 2 wk out) while he tries prilosec daily in the mornings.  Set up neurology referral regarding possible dementia.            Martie Lee - We need to get records from the [place].   Mainly what I need is prior imaging (head, abdomen, pelvis, etc), last 6-12 mo of labs, vaccine records, consult notes from specialists, and last 3-4 office notes.            Plan for f/u in 3-4 wk, but they can bring by the urine sample for culture soon.

## 2013-01-19 ENCOUNTER — Other Ambulatory Visit (INDEPENDENT_AMBULATORY_CARE_PROVIDER_SITE_OTHER): Payer: Medicare Other

## 2013-01-19 ENCOUNTER — Telehealth: Payer: Self-pay | Admitting: Medical

## 2013-01-19 DIAGNOSIS — R8290 Unspecified abnormal findings in urine: Secondary | ICD-10-CM

## 2013-01-19 DIAGNOSIS — R82998 Other abnormal findings in urine: Secondary | ICD-10-CM

## 2013-01-19 LAB — POCT URINALYSIS DIPSTICK
Bilirubin, UA: NEGATIVE
Blood, UA: NEGATIVE
Glucose, UA: NEGATIVE
Ketones, UA: NEGATIVE
Nitrite, UA: NEGATIVE
Spec Grav, UA: 1.01
pH, UA: 5

## 2013-01-19 NOTE — Telephone Encounter (Signed)
SIS [name] STATES PT STARTED GOING TO [name] BEGINN[facility] 3/13

## 2013-01-19 NOTE — Telephone Encounter (Signed)
[name] will you call this patient please and ask him to come in so we can get records. When he was here the other day, he did not sign a record release form at check out and shane needs a much of records on him.

## 2013-01-19 NOTE — Telephone Encounter (Signed)
Talked to [name] and she will come back and sign for him

## 2013-01-20 DIAGNOSIS — F0390 Unspecified dementia without behavioral disturbance: Secondary | ICD-10-CM

## 2013-01-20 DIAGNOSIS — R9089 Other abnormal findings on diagnostic imaging of central nervous system: Secondary | ICD-10-CM

## 2013-01-20 HISTORY — DX: Other abnormal findings on diagnostic imaging of central nervous system: R90.89

## 2013-01-20 HISTORY — DX: Unspecified dementia, unspecified severity, without behavioral disturbance, psychotic disturbance, mood disturbance, and anxiety: F03.90

## 2013-01-21 ENCOUNTER — Other Ambulatory Visit: Payer: Self-pay | Admitting: Medical

## 2013-01-21 LAB — URINE CULTURE

## 2013-01-21 MED ORDER — NITROFURANTOIN MONOHYD MACRO 100 MG PO CAPS: 100.0000 mg | ORAL_CAPSULE | Freq: Two times a day (BID) | ORAL | Status: DC

## 2013-01-25 ENCOUNTER — Telehealth: Payer: Self-pay | Admitting: Medical

## 2013-01-26 ENCOUNTER — Telehealth: Payer: Self-pay | Admitting: Family Medicine

## 2013-01-26 NOTE — Telephone Encounter (Signed)
pls set up Home Health nurse evaluation.

## 2013-01-26 NOTE — Telephone Encounter (Signed)
I spoke with the patients sister and made her aware that I spoke with Diane at [facility] and she states that she will work on the referral today. CLS

## 2013-01-26 NOTE — Telephone Encounter (Signed)
Patients sister would like to know about a home health nurse coming out to help her with her brother. CLS

## 2013-01-26 NOTE — Telephone Encounter (Signed)
Patients sister would like know about sending home health nurse out to her home to help out with her brother and what did you think and what does she need to do. CLS

## 2013-01-26 NOTE — Telephone Encounter (Signed)
Patients sister is aware of appointment with Dr. Terrace Arabia on 02/01/13 @ 300 pm. CLS [phone] [place], [place]

## 2013-01-29 ENCOUNTER — Telehealth: Payer: Self-pay | Admitting: Family Medicine

## 2013-01-29 NOTE — Telephone Encounter (Signed)
The chart is on your desk. CLS

## 2013-01-29 NOTE — Telephone Encounter (Signed)
Message copied by Janeice Robinson on Mon Jan 29, 2013 12:46 PM ------      Message from: Jac Canavan      Created: Sun Jan 28, 2013 12:33 PM       Pull chart             Rosalva Ferron -       Review chart regarding incontinence, workup, dementia. ------

## 2013-01-29 NOTE — Telephone Encounter (Signed)
I called the sister and made them aware that I was [facility]ing [facility] so a nurse can come in and check the situation to see about getting some in home help. CLS   Patient is also aware of his appointment at  [facility] on 02/01/13 @ 330 pm/. CLS

## 2013-01-31 ENCOUNTER — Telehealth: Payer: Self-pay | Admitting: Family Medicine

## 2013-01-31 ENCOUNTER — Other Ambulatory Visit: Payer: Self-pay | Admitting: Medical

## 2013-01-31 ENCOUNTER — Encounter: Payer: Self-pay | Admitting: Family Medicine

## 2013-01-31 MED ORDER — CIPROFLOXACIN HCL 500 MG PO TABS: 500.0000 mg | ORAL_TABLET | Freq: Two times a day (BID) | ORAL | Status: DC

## 2013-01-31 NOTE — Telephone Encounter (Signed)
i sent cipro.  i want to see him back in 1wk.  Lets try real hard to get a clean catch urine sample on his return visit.

## 2013-01-31 NOTE — Telephone Encounter (Signed)
Austin Bible called and states she has tried for 3 days to get a urine sample and she can't.  Will you please call in another antibiotic, as she can still smell in his wet clothes, and she states he still has an infection.  She needs something cheaper and something before the snow hits please.  [facility] [address]  Please advise Fenton Foy 0522 if any difference.

## 2013-02-02 NOTE — Telephone Encounter (Signed)
followup already scheduled for 02/14/13

## 2013-02-08 DIAGNOSIS — N319 Neuromuscular dysfunction of bladder, unspecified: Secondary | ICD-10-CM | POA: Insufficient documentation

## 2013-02-08 DIAGNOSIS — F079 Unspecified personality and behavioral disorder due to known physiological condition: Secondary | ICD-10-CM | POA: Insufficient documentation

## 2013-02-08 DIAGNOSIS — I679 Cerebrovascular disease, unspecified: Secondary | ICD-10-CM | POA: Insufficient documentation

## 2013-02-08 DIAGNOSIS — R269 Unspecified abnormalities of gait and mobility: Secondary | ICD-10-CM | POA: Insufficient documentation

## 2013-02-12 ENCOUNTER — Other Ambulatory Visit: Payer: Self-pay | Admitting: Neurology

## 2013-02-12 DIAGNOSIS — R269 Unspecified abnormalities of gait and mobility: Secondary | ICD-10-CM

## 2013-02-12 DIAGNOSIS — N319 Neuromuscular dysfunction of bladder, unspecified: Secondary | ICD-10-CM

## 2013-02-12 DIAGNOSIS — F079 Unspecified personality and behavioral disorder due to known physiological condition: Secondary | ICD-10-CM

## 2013-02-12 DIAGNOSIS — I679 Cerebrovascular disease, unspecified: Secondary | ICD-10-CM

## 2013-02-14 ENCOUNTER — Ambulatory Visit (INDEPENDENT_AMBULATORY_CARE_PROVIDER_SITE_OTHER): Payer: Medicare Other | Admitting: Medical

## 2013-02-14 ENCOUNTER — Encounter: Payer: Self-pay | Admitting: Medical

## 2013-02-14 VITALS — BP 120/68 | HR 92 | Temp 98.2°F | Resp 18

## 2013-02-14 DIAGNOSIS — E119 Type 2 diabetes mellitus without complications: Secondary | ICD-10-CM

## 2013-02-14 DIAGNOSIS — E1140 Type 2 diabetes mellitus with diabetic neuropathy, unspecified: Secondary | ICD-10-CM

## 2013-02-14 DIAGNOSIS — E1142 Type 2 diabetes mellitus with diabetic polyneuropathy: Secondary | ICD-10-CM

## 2013-02-14 DIAGNOSIS — R413 Other amnesia: Secondary | ICD-10-CM

## 2013-02-14 DIAGNOSIS — I679 Cerebrovascular disease, unspecified: Secondary | ICD-10-CM

## 2013-02-14 DIAGNOSIS — R32 Unspecified urinary incontinence: Secondary | ICD-10-CM

## 2013-02-14 DIAGNOSIS — N390 Urinary tract infection, site not specified: Secondary | ICD-10-CM

## 2013-02-14 DIAGNOSIS — R269 Unspecified abnormalities of gait and mobility: Secondary | ICD-10-CM

## 2013-02-14 DIAGNOSIS — R1310 Dysphagia, unspecified: Secondary | ICD-10-CM

## 2013-02-14 DIAGNOSIS — E1149 Type 2 diabetes mellitus with other diabetic neurological complication: Secondary | ICD-10-CM

## 2013-02-14 NOTE — Progress Notes (Signed)
Subjective: Here for recheck.  Accompanied by sister, caregiver today.  Since last visit he has seen neurology for consult.  Is pending MRI brain, has this the first week or March.  [name] notes no new problems.    Sister still concerned about him having incontinence. Soaks the bed, goes through depends frequently.  Larey Seat today trying to get out of the kitchen.  Fell down on knees.  He has issues with incontinence, on oxybutynin BID, but this doesn't seem to help much.  wearing pull ups all the time.   Sometimes he doesn't realize when he is wet.  He was +for urine on culture last visit, finished course of antibiotic.  He has issues with swallowing.  When eating, coughs a lot,feels like food doesn't go down right way.  Denies heartburn, no abdominal pain, some belching.  No vomiting, no food getting stuck in the throat.  Denies SOB, wheezing, dyspnea.   He does continues to smoke some.  Tried Prilosec some but no improvement.  Sister and other siblings that watch after him are concerned about his overall health and feel that it is time to pursue SNF.  They are increasingly having to watch after him more.  He is not driving, doesn't cook, dresses himself with help, baths self.  However, sister manages his medication, has to remind him to bath and change his underwear.   He tends to sleep a lot, sleeps most of the morning til about lunch.  Sister does report that he has some short term memory loss, sometimes with confusion.  He has numbness in fingers, toes.   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    Has trouble walking and very limited in his mobility.  Unsteady on his feet.  He has hx/o stroke 6 years ago, had left sided weakness, mostly this resolved.   [facility]  [facility] to place him in SNF as there was nothing they could offer.  Past Medical History  Diagnosis Date  . Hypertension   . Hyperlipidemia    . Diabetes mellitus   . Tobacco use disorder   . Bipolar disorder   . BPH (benign prostatic hypertrophy)   . Cerebellar ataxia   . CVA (cerebral infarction)   . Personal history of alcoholism   . Diabetic neuropathy   . Dementia   . Depression   . Anemia     anemia of chronic disease  . Falls   . Progressive gait disorder   . Urinary incontinence   . Cerebrovascular disease    ROS as in subjective   Objective:   Physical Exam  Filed Vitals:   02/14/13 1520  BP: 120/68  Pulse: 92  Temp: 98.2 F (36.8 C)  Resp: 18    General appearance: alert, no distress, WD/WN, seated in wheelchair Oral cavity: MMM, no lesions Neck: supple, no lymphadenopathy, no thyromegaly, no masses Heart: brief 2/6 systolic murmur heard best in right upper sternal border, otherwise RRR, normal S1, S2 Lungs: CTA bilaterally, no wheezes, rhonchi, or rales Extremities: no edema Pulses: 1+ symmetric Psychiatric: normal affect, behavior normal, pleasant    Assessment and Plan :    Encounter Diagnoses  Name Primary?  . Urinary incontinence Yes  . Infection of urinary tract   . Progressive gait disorder   . Dysphagia, unspecified   . Cerebrovascular disease   . Diabetes   . Diabetic neuropathy   .  Memory disorder    We reviewed his recent labs, the recent neurology notes.  He has MRI brain the first week of March per neurology.  He is limited in ADLs, has to ambulate either in wheelchair or with cane, but mostly refuses to use the cane.  He is at high risk for falls.   At this point he doesn't seem to be safe to be home alone.  Sisters are considering options for nursing home.  Will set up home health eval.  We set this up through [facility], but for whatever reason they can't complete this.  We will try different agency.  He will take urine cups home to try and get me sample and refrigerate and bring in ASAP after specimen given.  I will review his extensive medical chart from the [place] and update  accordingly.

## 2013-02-15 ENCOUNTER — Other Ambulatory Visit (INDEPENDENT_AMBULATORY_CARE_PROVIDER_SITE_OTHER): Payer: Medicare Other

## 2013-02-15 ENCOUNTER — Telehealth: Payer: Self-pay | Admitting: Medical

## 2013-02-15 DIAGNOSIS — R32 Unspecified urinary incontinence: Secondary | ICD-10-CM

## 2013-02-15 LAB — POCT URINALYSIS DIPSTICK
Nitrite, UA: NEGATIVE
Spec Grav, UA: 1.02
Urobilinogen, UA: NEGATIVE

## 2013-02-15 NOTE — Telephone Encounter (Signed)
LM

## 2013-02-19 ENCOUNTER — Ambulatory Visit
Admission: RE | Admit: 2013-02-19 | Discharge: 2013-02-19 | Disposition: A | Discharge status: Home / Self Care | Payer: Medicare Other | Source: Ambulatory Visit | Attending: Neurology | Admitting: Neurology

## 2013-02-19 DIAGNOSIS — R269 Unspecified abnormalities of gait and mobility: Secondary | ICD-10-CM

## 2013-02-19 DIAGNOSIS — F079 Unspecified personality and behavioral disorder due to known physiological condition: Secondary | ICD-10-CM

## 2013-02-19 DIAGNOSIS — I679 Cerebrovascular disease, unspecified: Secondary | ICD-10-CM

## 2013-02-19 DIAGNOSIS — N319 Neuromuscular dysfunction of bladder, unspecified: Secondary | ICD-10-CM

## 2013-02-26 ENCOUNTER — Telehealth: Payer: Self-pay | Admitting: Family Medicine

## 2013-02-26 NOTE — Telephone Encounter (Signed)
Message copied by Janeice Robinson on Mon Feb 26, 2013  3:35 PM ------      Message from: Jac Canavan      Created: Mon Feb 26, 2013  8:29 AM       pls call sister and see when his next neuro f/u is?  I have copy of the MRI brain and just wanted to see if they have discussed with neurology and next steps? ------

## 2013-02-26 NOTE — Telephone Encounter (Signed)
I called to [facility] t/w April Todd she will call pt today.

## 2013-02-26 NOTE — Telephone Encounter (Signed)
Patient's sister said the next appointment is on July 3rd at 215pm. She said they aware of the MRI results and the doctor said for him follow up in 6 months so they are. She wants him to see a urologists for wetting on himself. She would like to know what urologists would you recommend them seeing? CLS

## 2013-02-27 ENCOUNTER — Encounter: Payer: Self-pay | Admitting: Medical

## 2013-03-01 ENCOUNTER — Telehealth: Payer: Self-pay | Admitting: Family Medicine

## 2013-03-01 NOTE — Telephone Encounter (Signed)
Pt called wanted to know where we were referring for urologist.  I see nothing in notes.  I called [facility] [phone] appt 03/19/13 at 9:45 with Dr. Jacquelyne Balint.  Pt aware.

## 2013-03-20 ENCOUNTER — Encounter: Payer: Self-pay | Admitting: Medical

## 2013-06-21 ENCOUNTER — Encounter: Payer: Self-pay | Admitting: Neurology

## 2013-06-21 ENCOUNTER — Ambulatory Visit (INDEPENDENT_AMBULATORY_CARE_PROVIDER_SITE_OTHER): Payer: Medicare Other | Admitting: Neurology

## 2013-06-21 VITALS — BP 139/58 | HR 72 | Wt 172.0 lb

## 2013-06-21 DIAGNOSIS — I679 Cerebrovascular disease, unspecified: Secondary | ICD-10-CM

## 2013-06-21 DIAGNOSIS — N319 Neuromuscular dysfunction of bladder, unspecified: Secondary | ICD-10-CM

## 2013-06-21 DIAGNOSIS — R269 Unspecified abnormalities of gait and mobility: Secondary | ICD-10-CM

## 2013-06-21 DIAGNOSIS — F079 Unspecified personality and behavioral disorder due to known physiological condition: Secondary | ICD-10-CM

## 2013-06-21 NOTE — Progress Notes (Signed)
Reason for visit: Memory disturbance  [name] is an 72 y.o. male  History of present illness:  [name] is a 72 year old right-handed white male with a history of a progressive gait disorder and memory disorder. The patient has an associated issue with bladder control. MRI of the brain has shown evidence of extensive cerebrovascular disease along with diffuse cortical atrophy including severe atrophy of the mesial temporal regions bilaterally. The patient continues to have ongoing issues with urinary incontinence. The patient was taken off of the oxybutynin secondary to his cognitive side effects, and he has been placed on Myrbetriq, 25 mg daily. This has offered no real benefit over the oxybutynin. The patient is now in an assisted living situation, and he will have occasional falls, but usually he is safe, staying in a wheelchair. The patient continues to have frequent incontinence, and he seems to refuse getting on a schedule to urinate every 3 or 4 hours during the day. The patient does not seem to have any warning that he will void his bladder, and oftentimes he doesn't know that he is wet. The patient returns for an evaluation.  Past Medical History  Diagnosis Date  . Hypertension   . Hyperlipidemia   . Diabetes mellitus   . Tobacco use disorder   . Bipolar disorder   . BPH (benign prostatic hypertrophy)   . Cerebellar ataxia   . CVA (cerebral infarction)   . Personal history of alcoholism   . Diabetic neuropathy   . Dementia 01/2013    consult [facility]; Dr. Stephanie Acre  . Depression   . Anemia     anemia of chronic disease  . Falls   . Progressive gait disorder   . Urinary incontinence   . Cerebrovascular disease   . Foot pain, bilateral     hx/o plantar fascitis, diabetic neuropathy  . H/O bone density study 06/01/11    spine osteopenia, hip normal  . H/O echocardiogram 06/03/08    LV function normal 55-65%, LV wall mildly thickened, mild mitral  annular calcification; Dr. Jens Som  . MRI of brain abnormal 01/2013  . Dyslipidemia   . Heart murmur   . History of cataract   . Benign enlargement of prostate   . Gait disorder   . History of alcohol abuse   . Memory loss     Past Surgical History  Procedure Laterality Date  . Hernia repair    . Tonsillectomy      Family History  Problem Relation Age of Onset  . Cancer Father   . Alcoholism Brother     Social history:  reports that he has been smoking.  He does not have any smokeless tobacco history on file. He reports that he does not drink alcohol or use illicit drugs.  Allergies: No Known Allergies  Medications:  Current Outpatient Prescriptions on File Prior to Visit  Medication Sig Dispense Refill  . acetaminophen (TYLENOL) 325 MG tablet Take 650 mg by mouth every 6 (six) hours as needed.      Marland Kitchen amLODipine (NORVASC) 10 MG tablet Take 10 mg by mouth daily.      . Cholecalciferol (VITAMIN D3) 1000 UNITS CAPS Take 1,000 Units by mouth daily.       . clopidogrel (PLAVIX) 75 MG tablet Take 75 mg by mouth daily.      Marland Kitchen docusate sodium (COLACE) 100 MG capsule Take 100 mg by mouth 2 (two) times daily.      Marland Kitchen lisinopril (  PRINIVIL,ZESTRIL) 10 MG tablet Take 10 mg by mouth daily.      Marland Kitchen loratadine (CLARITIN) 10 MG tablet Take 10 mg by mouth daily.      . metFORMIN (GLUCOPHAGE) 500 MG tablet Take 500 mg by mouth 2 (two) times daily with a meal.      . nortriptyline (PAMELOR) 25 MG capsule Take 25 mg by mouth at bedtime.      . sertraline (ZOLOFT) 100 MG tablet Take 75 mg by mouth daily.       . simvastatin (ZOCOR) 40 MG tablet Take 40 mg by mouth every evening.       No current facility-administered medications on file prior to visit.    ROS:  Out of a complete 14 system review of symptoms, the patient complains only of the following symptoms, and all other reviewed systems are negative.  Urinary incontinence Memory disturbance Confusion Gait disturbance  Blood  pressure 139/58, pulse 72, weight 172 lb (78.019 kg).  Physical Exam  General: The patient is alert and cooperative at the time of the examination.  Skin: No significant peripheral edema is noted.   Neurologic Exam  Mental status: The Mini-Mental status examination done today shows a total score of 21/30. The patient is able to name 7 animals in 60 seconds.  Cranial nerves: Facial symmetry is present. Speech is normal, no aphasia or dysarthria is noted. Extraocular movements are full. Visual fields are full.  Motor: The patient has good strength in all 4 extremities.  Coordination: The patient has good finger-nose-finger and heel-to-shin bilaterally.  Gait and station: The patient requires assistance with standing. Once up, the patient tends to lean to the right and backwards, and he cannot ambulate independently. Tandem gait was not attempted. The patient mobilizes with a wheelchair.  Reflexes: Deep tendon reflexes are symmetric.   Assessment/Plan:  1. Vascular dementia  2. Gait disturbance  3. Urinary incontinence  Medications for the urinary incontinence had not been helpful. The Myrbetriq will be discontinued. The patient will be followed through urology in the near future. The patient may have worsening of bladder function on medication such as Aricept, but Namenda may be added in the future. The patient will followup in 4-6 months.  Marlan Palau MD 06/21/2013 8:16 PM  [facility] [address] [place], [place] [address]  Phone [phone] Fax [phone]

## 2013-12-11 ENCOUNTER — Other Ambulatory Visit (HOSPITAL_COMMUNITY): Payer: Self-pay | Admitting: Internal Medicine

## 2013-12-11 DIAGNOSIS — I639 Cerebral infarction, unspecified: Secondary | ICD-10-CM

## 2013-12-25 ENCOUNTER — Ambulatory Visit (HOSPITAL_COMMUNITY)
Admission: RE | Admit: 2013-12-25 | Discharge: 2013-12-25 | Disposition: A | Discharge status: Home / Self Care | Payer: Medicare Other | Source: Ambulatory Visit | Attending: Internal Medicine | Admitting: Internal Medicine

## 2013-12-25 DIAGNOSIS — Z1389 Encounter for screening for other disorder: Secondary | ICD-10-CM | POA: Insufficient documentation

## 2013-12-25 DIAGNOSIS — I639 Cerebral infarction, unspecified: Secondary | ICD-10-CM

## 2013-12-25 DIAGNOSIS — Z8673 Personal history of transient ischemic attack (TIA), and cerebral infarction without residual deficits: Secondary | ICD-10-CM | POA: Insufficient documentation

## 2014-01-08 ENCOUNTER — Ambulatory Visit (INDEPENDENT_AMBULATORY_CARE_PROVIDER_SITE_OTHER): Payer: Medicare Other | Admitting: Neurology

## 2014-01-08 ENCOUNTER — Encounter (INDEPENDENT_AMBULATORY_CARE_PROVIDER_SITE_OTHER): Payer: Self-pay

## 2014-01-08 ENCOUNTER — Encounter: Payer: Self-pay | Admitting: Neurology

## 2014-01-08 ENCOUNTER — Telehealth: Payer: Self-pay | Admitting: Neurology

## 2014-01-08 VITALS — BP 134/63 | HR 56

## 2014-01-08 DIAGNOSIS — R269 Unspecified abnormalities of gait and mobility: Secondary | ICD-10-CM

## 2014-01-08 DIAGNOSIS — F079 Unspecified personality and behavioral disorder due to known physiological condition: Secondary | ICD-10-CM

## 2014-01-08 DIAGNOSIS — I679 Cerebrovascular disease, unspecified: Secondary | ICD-10-CM

## 2014-01-08 MED ORDER — MEMANTINE HCL 10 MG PO TABS: 10.0000 mg | ORAL_TABLET | Freq: Two times a day (BID) | ORAL | Status: DC

## 2014-01-08 NOTE — Patient Instructions (Signed)

## 2014-01-08 NOTE — Progress Notes (Signed)
Reason for visit: Gait, memory disorder  [name] is an 73 y.o. male  History of present illness:  [name] is a 73 year old right-handed white male with a history of cerebrovascular disease associated with a significant gait disorder and a memory disorder. The patient has a neurogenic bladder, likely in part from the cerebrovascular disease as well. The patient is basically wheelchair-bound, he stands only for transfers. The patient has done better since last seen with bladder control. The patient generally is able to get to the bathroom during the day, but in the evening hours, he may have urinary incontinence. The patient denies any other new medical issues that have come up since last seen. The patient is not on any medications for memory.  Past Medical History  Diagnosis Date  . Hypertension   . Hyperlipidemia   . Diabetes mellitus   . Tobacco use disorder   . Bipolar disorder   . BPH (benign prostatic hypertrophy)   . Cerebellar ataxia   . CVA (cerebral infarction)   . Personal history of alcoholism   . Diabetic neuropathy   . Dementia 01/2013    consult [facility]; Dr. Stephanie Acreharles Elnita Surprenant  . Depression   . Anemia     anemia of chronic disease  . Falls   . Progressive gait disorder   . Urinary incontinence   . Cerebrovascular disease   . Foot pain, bilateral     hx/o plantar fascitis, diabetic neuropathy  . H/O bone density study 06/01/11    spine osteopenia, hip normal  . H/O echocardiogram 06/03/08    LV function normal 55-65%, LV wall mildly thickened, mild mitral annular calcification; Dr. Jens Somrenshaw  . MRI of brain abnormal 01/2013  . Dyslipidemia   . Heart murmur   . History of cataract   . Benign enlargement of prostate   . Gait disorder   . History of alcohol abuse   . Memory loss     Past Surgical History  Procedure Laterality Date  . Hernia repair    . Tonsillectomy      Family History  Problem Relation Age of Onset  . Cancer  Father   . Alcoholism Brother     Social history:  reports that he has been smoking.  He has never used smokeless tobacco. He reports that he does not drink alcohol or use illicit drugs.   No Known Allergies  Medications:  Current Outpatient Prescriptions on File Prior to Visit  Medication Sig Dispense Refill  . acetaminophen (TYLENOL) 325 MG tablet Take 650 mg by mouth every 6 (six) hours as needed.      Marland Kitchen. amLODipine (NORVASC) 10 MG tablet Take 10 mg by mouth daily.      . Cholecalciferol (VITAMIN D3) 1000 UNITS CAPS Take 1,000 Units by mouth daily.       . clopidogrel (PLAVIX) 75 MG tablet Take 75 mg by mouth daily.      Marland Kitchen. docusate sodium (COLACE) 100 MG capsule Take 100 mg by mouth 2 (two) times daily.      Marland Kitchen. lisinopril (PRINIVIL,ZESTRIL) 10 MG tablet Take 10 mg by mouth daily.      Marland Kitchen. loratadine (CLARITIN) 10 MG tablet Take 10 mg by mouth daily.      . metFORMIN (GLUCOPHAGE) 500 MG tablet Take 500 mg by mouth 2 (two) times daily with a meal.      . Multiple Vitamin (MULTIVITAMIN) tablet Take 1 tablet by mouth daily.      .Marland Kitchen  sertraline (ZOLOFT) 100 MG tablet Take 75 mg by mouth daily.        No current facility-administered medications on file prior to visit.    ROS:  Out of a complete 14 system review of symptoms, the patient complains only of the following symptoms, and all other reviewed systems are negative.  Runny nose Incontinence of bowels, bladder Walking difficulty  Blood pressure 134/63, pulse 56, weight 0 lb (0 kg).  Physical Exam  General: The patient is alert and cooperative at the time of the examination.  Skin: No significant peripheral edema is noted.   Neurologic Exam  Mental status: The Mini-Mental status examination done today shows a total score of 24/30.  Cranial nerves: Facial symmetry is present. Speech is normal, no aphasia or dysarthria is noted. Extraocular movements are full. Visual fields are full.  Motor: The patient has good strength in  all 4 extremities.  Sensory examination: Soft touch sensation on the face, arms, and legs is symmetric.  Coordination: The patient has good finger-nose-finger and heel-to-shin bilaterally.  Gait and station: The patient requires some assistance with standing. Once up, the patient is able to walk a short distance with assistance, with a wide based gait. The patient is able to stand independently. Tandem gait was not attempted.  Reflexes: Deep tendon reflexes are symmetric.   Assessment/Plan:  1. Cerebrovascular disease  2. Gait disturbance  3. Memory disturbance  4. Neurogenic bladder  The patient actually has improved with his bladder control, and his memory testing has improved as well. The patient will be placed on Namenda, and a starter pack was given today. The patient will be maintained on 10 mg twice daily. The patient will followup through this office in 6 months.  Marlan Palau MD 01/08/2014 2:24 PM  [facility] [address] [place], [place] [address]  Phone [phone] Fax [phone]

## 2014-01-08 NOTE — Telephone Encounter (Signed)
PT has appt at 11:00 am this morning with Dr. Anne HahnWillis.  His sister, Elease Hashimotoatricia, usually comes with him but will be unable to today.  She has asked that when Dr. Anne HahnWillis fills out the paperwork for the Assisted Living facility that he is very detailed regarding what is going on with the PT's dementia.  She doesn't think the facility knows the severity or the progression of his dementia.  She wants to make sure that they know what is going on and what is best for her brother.  Contact Elease Hashimotoatricia if needed.

## 2014-01-08 NOTE — Telephone Encounter (Signed)
The patient was seen and evaluated today. Please refer to note. The memory issue actually seems to have improved from the last visit. The patient has been placed on Namenda.

## 2014-01-08 NOTE — Telephone Encounter (Signed)
Please advise 

## 2014-03-01 ENCOUNTER — Other Ambulatory Visit: Payer: Self-pay | Admitting: Neurology

## 2014-07-01 ENCOUNTER — Telehealth: Payer: Self-pay | Admitting: *Deleted

## 2014-07-01 NOTE — Telephone Encounter (Signed)
Called patient was informed that he no longer resides at that facility, [phone] no answer, so I called his sister [name], left message on her vm to return the call to r/s patient's appointment with NP MM, Willis patient has never been seen by [facility] per Epic and [facility].

## 2014-07-09 ENCOUNTER — Ambulatory Visit: Payer: Self-pay | Admitting: Adult Health

## 2014-09-22 ENCOUNTER — Emergency Department (HOSPITAL_COMMUNITY): Payer: Medicare Other

## 2014-09-22 ENCOUNTER — Encounter (HOSPITAL_COMMUNITY): Payer: Self-pay | Admitting: Emergency Medicine

## 2014-09-22 ENCOUNTER — Emergency Department (HOSPITAL_COMMUNITY)
Admission: EM | Admit: 2014-09-22 | Discharge: 2014-09-23 | Disposition: A | Discharge status: Home / Self Care | Payer: Medicare Other | Attending: Emergency Medicine | Admitting: Emergency Medicine

## 2014-09-22 DIAGNOSIS — I10 Essential (primary) hypertension: Secondary | ICD-10-CM | POA: Diagnosis not present

## 2014-09-22 DIAGNOSIS — D538 Other specified nutritional anemias: Secondary | ICD-10-CM | POA: Diagnosis not present

## 2014-09-22 DIAGNOSIS — Z9181 History of falling: Secondary | ICD-10-CM | POA: Insufficient documentation

## 2014-09-22 DIAGNOSIS — D649 Anemia, unspecified: Secondary | ICD-10-CM | POA: Insufficient documentation

## 2014-09-22 DIAGNOSIS — Z79899 Other long term (current) drug therapy: Secondary | ICD-10-CM | POA: Insufficient documentation

## 2014-09-22 DIAGNOSIS — E785 Hyperlipidemia, unspecified: Secondary | ICD-10-CM | POA: Insufficient documentation

## 2014-09-22 DIAGNOSIS — F0390 Unspecified dementia without behavioral disturbance: Secondary | ICD-10-CM | POA: Diagnosis not present

## 2014-09-22 DIAGNOSIS — Z720 Tobacco use: Secondary | ICD-10-CM | POA: Insufficient documentation

## 2014-09-22 DIAGNOSIS — R011 Cardiac murmur, unspecified: Secondary | ICD-10-CM | POA: Diagnosis not present

## 2014-09-22 DIAGNOSIS — R531 Weakness: Secondary | ICD-10-CM | POA: Diagnosis present

## 2014-09-22 DIAGNOSIS — Z8739 Personal history of other diseases of the musculoskeletal system and connective tissue: Secondary | ICD-10-CM | POA: Insufficient documentation

## 2014-09-22 DIAGNOSIS — E1140 Type 2 diabetes mellitus with diabetic neuropathy, unspecified: Secondary | ICD-10-CM | POA: Diagnosis not present

## 2014-09-22 DIAGNOSIS — F319 Bipolar disorder, unspecified: Secondary | ICD-10-CM | POA: Diagnosis not present

## 2014-09-22 DIAGNOSIS — N400 Enlarged prostate without lower urinary tract symptoms: Secondary | ICD-10-CM | POA: Insufficient documentation

## 2014-09-22 LAB — CBC WITH DIFFERENTIAL/PLATELET
BASOS ABS: 0 10*3/uL (ref 0.0–0.1)
Basophils Relative: 1 % (ref 0–1)
EOS ABS: 0.6 10*3/uL (ref 0.0–0.7)
EOS PCT: 9 % — AB (ref 0–5)
HCT: 26.1 % — ABNORMAL LOW (ref 39.0–52.0)
Hemoglobin: 8.5 g/dL — ABNORMAL LOW (ref 13.0–17.0)
Lymphocytes Relative: 24 % (ref 12–46)
Lymphs Abs: 1.5 10*3/uL (ref 0.7–4.0)
MCH: 28.1 pg (ref 26.0–34.0)
MCHC: 32.6 g/dL (ref 30.0–36.0)
MCV: 86.1 fL (ref 78.0–100.0)
Monocytes Absolute: 0.7 10*3/uL (ref 0.1–1.0)
Monocytes Relative: 11 % (ref 3–12)
Neutro Abs: 3.6 10*3/uL (ref 1.7–7.7)
Neutrophils Relative %: 55 % (ref 43–77)
PLATELETS: 197 10*3/uL (ref 150–400)
RBC: 3.03 MIL/uL — ABNORMAL LOW (ref 4.22–5.81)
RDW: 13.4 % (ref 11.5–15.5)
WBC: 6.4 10*3/uL (ref 4.0–10.5)

## 2014-09-22 LAB — URINALYSIS, ROUTINE W REFLEX MICROSCOPIC
Bilirubin Urine: NEGATIVE
Glucose, UA: NEGATIVE mg/dL
Hgb urine dipstick: NEGATIVE
KETONES UR: NEGATIVE mg/dL
LEUKOCYTES UA: NEGATIVE
NITRITE: NEGATIVE
PROTEIN: 100 mg/dL — AB
Specific Gravity, Urine: 1.02 (ref 1.005–1.030)
Urobilinogen, UA: 0.2 mg/dL (ref 0.0–1.0)
pH: 5.5 (ref 5.0–8.0)

## 2014-09-22 LAB — COMPREHENSIVE METABOLIC PANEL
ALT: 8 U/L (ref 0–53)
AST: 13 U/L (ref 0–37)
Albumin: 3.2 g/dL — ABNORMAL LOW (ref 3.5–5.2)
Alkaline Phosphatase: 92 U/L (ref 39–117)
Anion gap: 14 (ref 5–15)
BUN: 24 mg/dL — ABNORMAL HIGH (ref 6–23)
CALCIUM: 9.3 mg/dL (ref 8.4–10.5)
CO2: 24 mEq/L (ref 19–32)
Chloride: 104 mEq/L (ref 96–112)
Creatinine, Ser: 1.43 mg/dL — ABNORMAL HIGH (ref 0.50–1.35)
GFR calc non Af Amer: 46 mL/min — ABNORMAL LOW (ref >90)
GFR, EST AFRICAN AMERICAN: 54 mL/min — AB (ref >90)
GLUCOSE: 191 mg/dL — AB (ref 70–99)
Potassium: 3.9 mEq/L (ref 3.7–5.3)
Sodium: 142 mEq/L (ref 137–147)
TOTAL PROTEIN: 7.5 g/dL (ref 6.0–8.3)
Total Bilirubin: 0.2 mg/dL — ABNORMAL LOW (ref 0.3–1.2)

## 2014-09-22 LAB — TROPONIN I

## 2014-09-22 LAB — URINE MICROSCOPIC-ADD ON

## 2014-09-22 NOTE — ED Notes (Signed)
Pt is resident of [facility], reportedly has had generalized weakness since yesterday, generalized pain, is awake, alert, oriented at this time, normal mental status.  Pt also with decreased appetite, denies vomiting, diarrhea or fever

## 2014-09-22 NOTE — Discharge Instructions (Signed)
Anemia, Nonspecific Anemia is a condition in which the concentration of red blood cells or hemoglobin in the blood is below normal. Hemoglobin is a substance in red blood cells that carries oxygen to the tissues of the body. Anemia results in not enough oxygen reaching these tissues.  CAUSES  Common causes of anemia include:   Excessive bleeding. Bleeding may be internal or external. This includes excessive bleeding from periods (in women) or from the intestine.   Poor nutrition.   Chronic kidney, thyroid, and liver disease.  Bone marrow disorders that decrease red blood cell production.  Cancer and treatments for cancer.  HIV, AIDS, and their treatments.  Spleen problems that increase red blood cell destruction.  Blood disorders.  Excess destruction of red blood cells due to infection, medicines, and autoimmune disorders. SIGNS AND SYMPTOMS   Minor weakness.   Dizziness.   Headache.  Palpitations.   Shortness of breath, especially with exercise.   Paleness.  Cold sensitivity.  Indigestion.  Nausea.  Difficulty sleeping.  Difficulty concentrating. Symptoms may occur suddenly or they may develop slowly.  DIAGNOSIS  Additional blood tests are often needed. These help your health care provider determine the best treatment. Your health care provider will check your stool for blood and look for other causes of blood loss.  TREATMENT  Treatment varies depending on the cause of the anemia. Treatment can include:   Supplements of iron, vitamin B12, or folic acid.   Hormone medicines.   A blood transfusion. This may be needed if blood loss is severe.   Hospitalization. This may be needed if there is significant continual blood loss.   Dietary changes.  Spleen removal. HOME CARE INSTRUCTIONS Keep all follow-up appointments. It often takes many weeks to correct anemia, and having your health care provider check on your condition and your response to  treatment is very important. SEEK IMMEDIATE MEDICAL CARE IF:   You develop extreme weakness, shortness of breath, or chest pain.   You become dizzy or have trouble concentrating.  You develop heavy vaginal bleeding.   You develop a rash.   You have bloody or black, tarry stools.   You faint.   You vomit up blood.   You vomit repeatedly.   You have abdominal pain.  You have a fever or persistent symptoms for more than 2-3 days.   You have a fever and your symptoms suddenly get worse.   You are dehydrated.  MAKE SURE YOU:  Understand these instructions.  Will watch your condition.  Will get help right away if you are not doing well or get worse. Document Released: 01/13/2005 Document Revised: 08/08/2013 Document Reviewed: 06/01/2013 [facility] Patient Information 2015 [employer] [place]. This information is not intended to replace advice given to you by your health care provider. Make sure you discuss any questions you have with your health care provider.  Patient's workup here negative for except for the finding of anemia. Hemoglobin is in the 8 range this will need to be followed carefully at the nursing facility if it drops below 8 usually blood transfusion is indicated. Rest of workup was negative.

## 2014-09-22 NOTE — ED Provider Notes (Signed)
CSN: [id number]     Arrival date & time 09/22/14  1952 History  This chart was scribed for Vanetta Mulders, MD by Richarda Overlie, ED Scribe. This patient was seen in room APA10/APA10 and the patient's care was started 8:23 PM.    Chief Complaint  Patient presents with  . Weakness   Level 5 Caveat   The history is provided by the patient. No language interpreter was used.   HPI Comments: [name] is a 73 y.o. male who presents to the Emergency Department complaining of generalized weakness that started yesterday. Patient is a resident of [facility]. He states he is feeling good currently and is in no pain.    Past Medical History  Diagnosis Date  . Hypertension   . Hyperlipidemia   . Diabetes mellitus   . Tobacco use disorder   . Bipolar disorder   . BPH (benign prostatic hypertrophy)   . Cerebellar ataxia   . CVA (cerebral infarction)   . Personal history of alcoholism   . Diabetic neuropathy   . Dementia 01/2013    consult [facility]; Dr. Stephanie Acre  . Depression   . Anemia     anemia of chronic disease  . Falls   . Progressive gait disorder   . Urinary incontinence   . Cerebrovascular disease   . Foot pain, bilateral     hx/o plantar fascitis, diabetic neuropathy  . H/O bone density study 06/01/11    spine osteopenia, hip normal  . H/O echocardiogram 06/03/08    LV function normal 55-65%, LV wall mildly thickened, mild mitral annular calcification; Dr. Jens Som  . MRI of brain abnormal 01/2013  . Dyslipidemia   . Heart murmur   . History of cataract   . Benign enlargement of prostate   . Gait disorder   . History of alcohol abuse   . Memory loss    Past Surgical History  Procedure Laterality Date  . Hernia repair    . Tonsillectomy     Family History  Problem Relation Age of Onset  . Cancer Father   . Alcoholism Brother    History  Substance Use Topics  . Smoking status: Current Every Day Smoker  . Smokeless tobacco: Never Used      Comment: 10 cigs a day  . Alcohol Use: No     Comment: history of alcohol abuse    Review of Systems  Constitutional: Negative for fever and chills.  HENT: Negative for rhinorrhea and sore throat.   Eyes: Negative for visual disturbance.  Respiratory: Negative for cough and shortness of breath.   Cardiovascular: Negative for chest pain and leg swelling.  Gastrointestinal: Negative for abdominal pain.  Genitourinary: Negative for dysuria.  Musculoskeletal: Negative for back pain and neck pain.  Skin: Negative for rash.  Neurological: Negative for headaches.  Hematological: Does not bruise/bleed easily.  Psychiatric/Behavioral: Negative for confusion.      Allergies  Review of patient's allergies indicates no known allergies.  Home Medications   Prior to Admission medications   Medication Sig Start Date End Date Taking? Authorizing Provider  acetaminophen (TYLENOL) 325 MG tablet Take 650 mg by mouth 4 (four) times daily.    Yes Historical Provider, MD  acetaminophen (TYLENOL) 500 MG tablet Take 500 mg by mouth every 4 (four) hours as needed for mild pain or fever.   Yes Historical Provider, MD  alum & mag hydroxide-simeth (GERI-LANTA) 200-200-20 MG/5ML suspension Take 30 mLs by mouth every 6 (six)  hours as needed for indigestion or heartburn.   Yes Historical Provider, MD  atorvastatin (LIPITOR) 20 MG tablet Take 20 mg by mouth daily.   Yes Historical Provider, MD  buPROPion (WELLBUTRIN XL) 150 MG 24 hr tablet Take 150 mg by mouth daily.   Yes Historical Provider, MD  clopidogrel (PLAVIX) 75 MG tablet Take 75 mg by mouth daily.   Yes Historical Provider, MD  Ferrous Gluconate 325 (36 FE) MG TABS Take 1 tablet by mouth daily.   Yes Historical Provider, MD  ferrous sulfate 325 (65 FE) MG tablet Take 325 mg by mouth daily with breakfast.   Yes Historical Provider, MD  guaifenesin (GERI-TUSSIN) 100 MG/5ML syrup Take 200 mg by mouth every 6 (six) hours as needed for cough.   Yes  Historical Provider, MD  guaifenesin (ROBITUSSIN) 100 MG/5ML syrup Take 200 mg by mouth 4 (four) times daily as needed for cough.   Yes Historical Provider, MD  lisinopril (PRINIVIL,ZESTRIL) 40 MG tablet Take 40 mg by mouth daily.   Yes Historical Provider, MD  liver oil-zinc oxide (DESITIN) 40 % ointment Apply 1 application topically 2 (two) times daily.   Yes Historical Provider, MD  loperamide (IMODIUM) 2 MG capsule Take 2 mg by mouth as needed for diarrhea or loose stools.   Yes Historical Provider, MD  loratadine (CLARITIN) 10 MG tablet Take 10 mg by mouth daily.   Yes Historical Provider, MD  magnesium hydroxide (MILK OF MAGNESIA) 400 MG/5ML suspension Take 30 mLs by mouth daily as needed for mild constipation.   Yes Historical Provider, MD  memantine (NAMENDA) 10 MG tablet Take 10 mg by mouth 2 (two) times daily.   Yes Historical Provider, MD  metFORMIN (GLUCOPHAGE) 500 MG tablet Take 500 mg by mouth 2 (two) times daily with a meal.   Yes Historical Provider, MD  oxybutynin (DITROPAN) 5 MG tablet Take 10 mg by mouth daily.   Yes Historical Provider, MD  pantoprazole (PROTONIX) 40 MG tablet Take 40 mg by mouth daily.   Yes Historical Provider, MD  sertraline (ZOLOFT) 100 MG tablet Take 100 mg by mouth daily.    Yes Historical Provider, MD   BP 164/62  Pulse 54  Temp(Src) 98 F (36.7 C) (Oral)  Resp 18  SpO2 94% Physical Exam  Nursing note and vitals reviewed. Constitutional: He appears well-developed and well-nourished.  HENT:  Head: Normocephalic and atraumatic.  Cardiovascular: Normal rate, regular rhythm and normal heart sounds.   No murmur heard. Pulmonary/Chest: Breath sounds normal. No respiratory distress. He has no wheezes. He has no rales.  Abdominal: Bowel sounds are normal. He exhibits no distension.  Neurological: He is alert.  Skin: Skin is warm and dry.  Psychiatric: He has a normal mood and affect.    ED Course  Procedures  DIAGNOSTIC STUDIES: Oxygen  Saturation is 93% on RA, normal by my interpretation.    COORDINATION OF CARE: 8:28 PM Discussed treatment plan with pt at bedside and pt agreed to plan.   Labs Review Labs Reviewed  CBC WITH DIFFERENTIAL - Abnormal; Notable for the following:    RBC 3.03 (*)    Hemoglobin 8.5 (*)    HCT 26.1 (*)    Eosinophils Relative 9 (*)    All other components within normal limits  COMPREHENSIVE METABOLIC PANEL - Abnormal; Notable for the following:    Glucose, Bld 191 (*)    BUN 24 (*)    Creatinine, Ser 1.43 (*)    Albumin 3.2 (*)  Total Bilirubin 0.2 (*)    GFR calc non Af Amer 46 (*)    GFR calc Af Amer 54 (*)    All other components within normal limits  URINALYSIS, ROUTINE W REFLEX MICROSCOPIC - Abnormal; Notable for the following:    Protein, ur 100 (*)    All other components within normal limits  TROPONIN I  URINE MICROSCOPIC-ADD ON   Results for orders placed during the hospital encounter of 09/22/14  CBC WITH DIFFERENTIAL      Result Value Ref Range   WBC 6.4  4.0 - 10.5 K/uL   RBC 3.03 (*) 4.22 - 5.81 MIL/uL   Hemoglobin 8.5 (*) 13.0 - 17.0 g/dL   HCT 16.126.1 (*) 09.639.0 - 04.552.0 %   MCV 86.1  78.0 - 100.0 fL   MCH 28.1  26.0 - 34.0 pg   MCHC 32.6  30.0 - 36.0 g/dL   RDW 40.913.4  81.111.5 - 91.415.5 %   Platelets 197  150 - 400 K/uL   Neutrophils Relative % 55  43 - 77 %   Neutro Abs 3.6  1.7 - 7.7 K/uL   Lymphocytes Relative 24  12 - 46 %   Lymphs Abs 1.5  0.7 - 4.0 K/uL   Monocytes Relative 11  3 - 12 %   Monocytes Absolute 0.7  0.1 - 1.0 K/uL   Eosinophils Relative 9 (*) 0 - 5 %   Eosinophils Absolute 0.6  0.0 - 0.7 K/uL   Basophils Relative 1  0 - 1 %   Basophils Absolute 0.0  0.0 - 0.1 K/uL  COMPREHENSIVE METABOLIC PANEL      Result Value Ref Range   Sodium 142  137 - 147 mEq/L   Potassium 3.9  3.7 - 5.3 mEq/L   Chloride 104  96 - 112 mEq/L   CO2 24  19 - 32 mEq/L   Glucose, Bld 191 (*) 70 - 99 mg/dL   BUN 24 (*) 6 - 23 mg/dL   Creatinine, Ser 7.821.43 (*) 0.50 - 1.35  mg/dL   Calcium 9.3  8.4 - 95.610.5 mg/dL   Total Protein 7.5  6.0 - 8.3 g/dL   Albumin 3.2 (*) 3.5 - 5.2 g/dL   AST 13  0 - 37 U/L   ALT 8  0 - 53 U/L   Alkaline Phosphatase 92  39 - 117 U/L   Total Bilirubin 0.2 (*) 0.3 - 1.2 mg/dL   GFR calc non Af Amer 46 (*) >90 mL/min   GFR calc Af Amer 54 (*) >90 mL/min   Anion gap 14  5 - 15  TROPONIN I      Result Value Ref Range   Troponin I <0.30  <0.30 ng/mL  URINALYSIS, ROUTINE W REFLEX MICROSCOPIC      Result Value Ref Range   Color, Urine YELLOW  YELLOW   APPearance CLEAR  CLEAR   Specific Gravity, Urine 1.020  1.005 - 1.030   pH 5.5  5.0 - 8.0   Glucose, UA NEGATIVE  NEGATIVE mg/dL   Hgb urine dipstick NEGATIVE  NEGATIVE   Bilirubin Urine NEGATIVE  NEGATIVE   Ketones, ur NEGATIVE  NEGATIVE mg/dL   Protein, ur 213100 (*) NEGATIVE mg/dL   Urobilinogen, UA 0.2  0.0 - 1.0 mg/dL   Nitrite NEGATIVE  NEGATIVE   Leukocytes, UA NEGATIVE  NEGATIVE  URINE MICROSCOPIC-ADD ON      Result Value Ref Range   Squamous Epithelial / LPF RARE  RARE  WBC, UA 0-2  <3 WBC/hpf   RBC / HPF 0-2  <3 RBC/hpf   Bacteria, UA RARE  RARE   Patient's labs otherwise showed no evidence urinary tract infection and no significant electrolyte abnormalities. No leukocytosis.  Imaging Review Dg Chest Portable 1 View  09/22/2014   CLINICAL DATA:  Generalized weakness and chest pain 1 day.  EXAM: PORTABLE CHEST - 1 VIEW  COMPARISON:  05/31/2008  FINDINGS: Somewhat lordotic positioning is demonstrated. Lungs are adequately inflated without focal consolidation or effusion. Cardiomediastinal silhouette is within normal. There is calcified plaque over the aortic arch. Remainder the exam is unchanged.  IMPRESSION: No acute cardiopulmonary disease.   Electronically Signed   By: Elberta Fortis M.D.   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 This can be followed at the nursing home. Patient's hemoglobin is not below 8. Patient's workup otherwise normal. The patient's indices for the anemia shows a not to be consistent with iron deficiency anemia. Patient will be discharged back to levels above to be followed closely.  Chest x-rays negative for pneumonia. EKG had no acute changes.   I personally performed the services described in this documentation, which was scribed in my presence. The recorded information has been reviewed and is accurate.     Vanetta Mulders, MD 09/22/14 2352

## 2014-09-23 NOTE — ED Notes (Signed)
Patient had medium incontinent bowel movement,

## 2014-09-23 NOTE — ED Notes (Signed)
Pt transported via [facility] back to [facility]. Sister Elease Hashimotoatricia notified. Report called and discharge reviewed with staff.

## 2014-09-23 NOTE — ED Notes (Signed)
Called patients sister Elease Hashimotoatricia to update on discharge status.

## 2014-09-23 NOTE — ED Notes (Signed)
Report called to [facility], EMS called for transport.

## 2014-09-23 NOTE — ED Notes (Signed)
Patient had medium size bowel movement, incon

## 2014-09-25 IMAGING — US US AORTA
1 series · 10 of 10 positions shown · non-contrast
Comparison: None.

CLINICAL DATA: Abdominal aortic aneurysm screening.

EXAM:
ULTRASOUND OF ABDOMINAL AORTA
TECHNIQUE: Ultrasound examination of the abdominal aorta was performed to
evaluate for abdominal aortic aneurysm.

[Series 1: us aorta · 0.29mm/px · 10 of 10 slices shown]
[im 1/10]
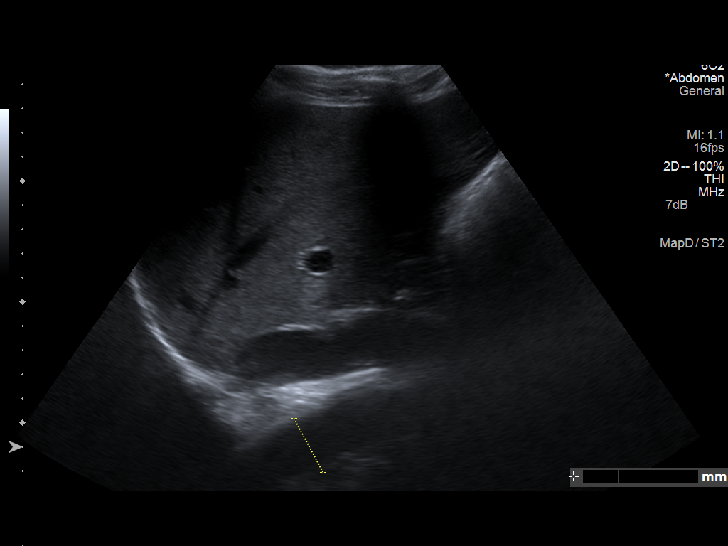
[im 2/10]
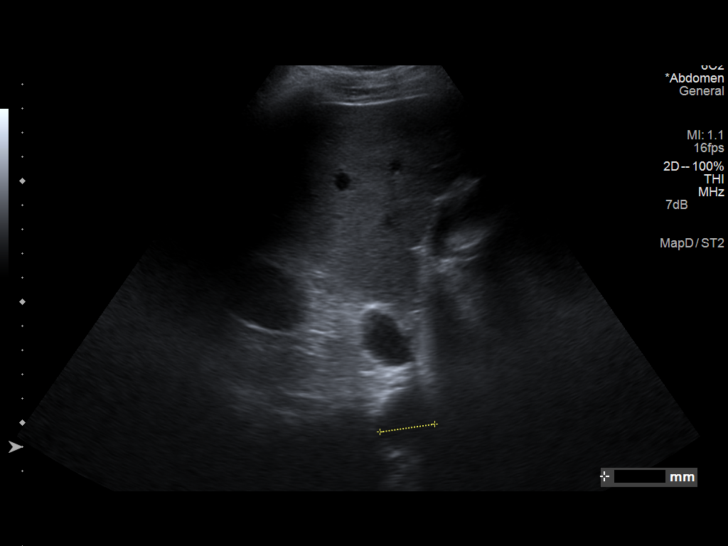
[im 3/10]
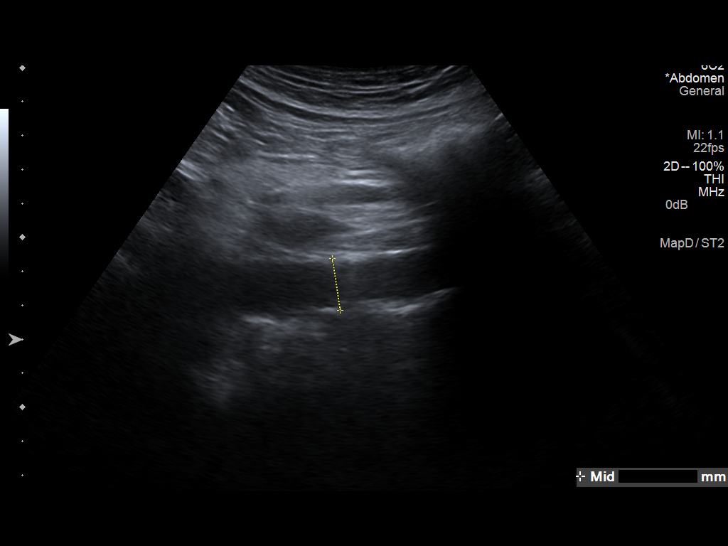
[im 4/10]
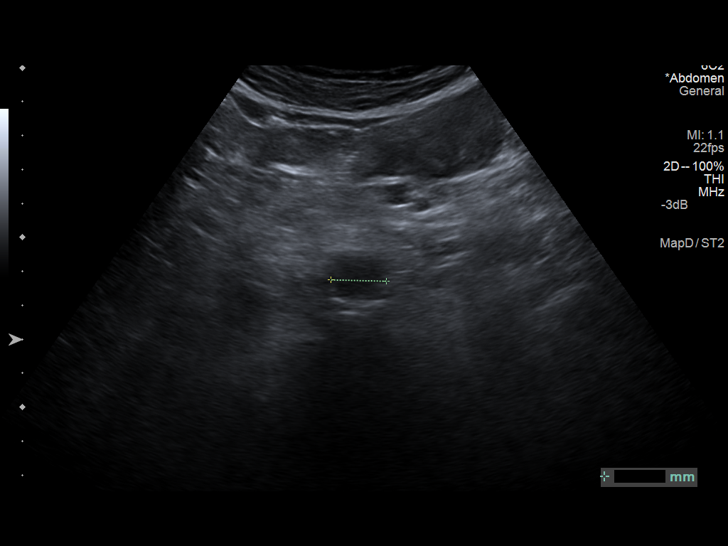
[im 5/10]
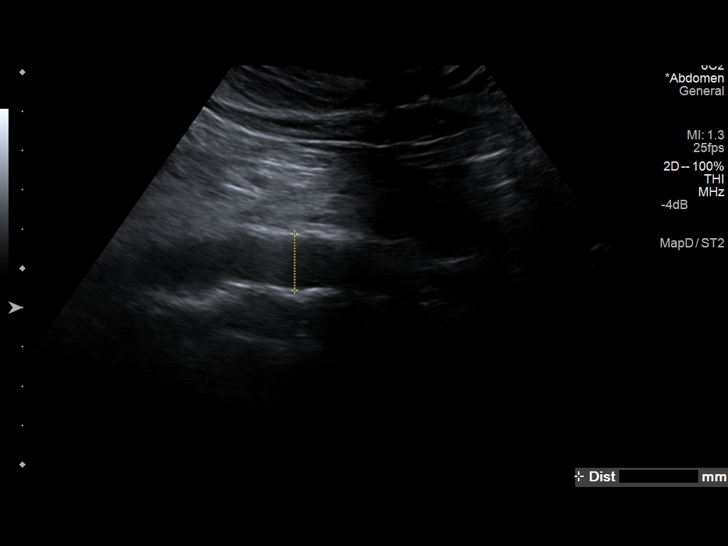
[im 6/10]
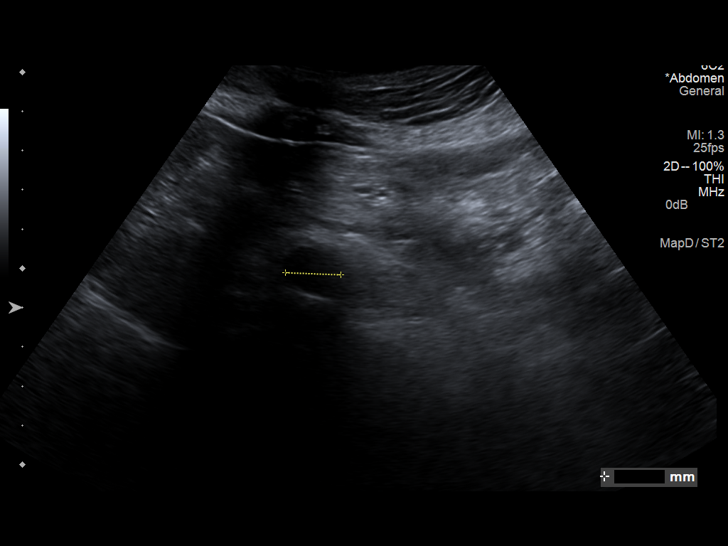
[im 7/10]
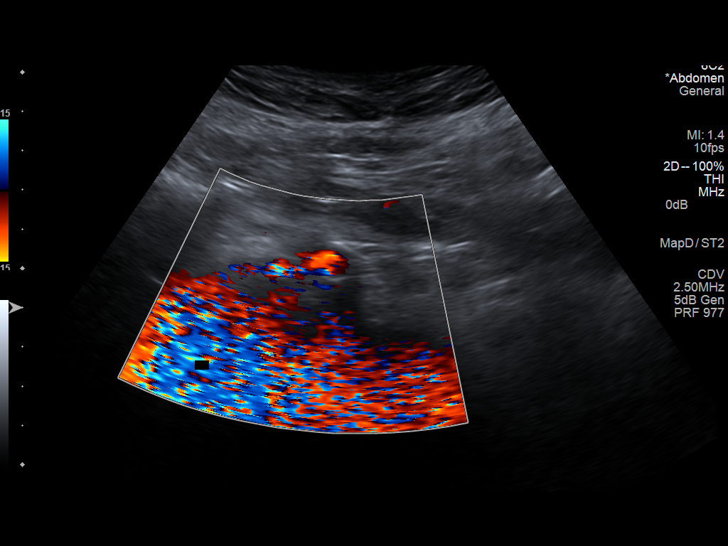
[im 8/10]
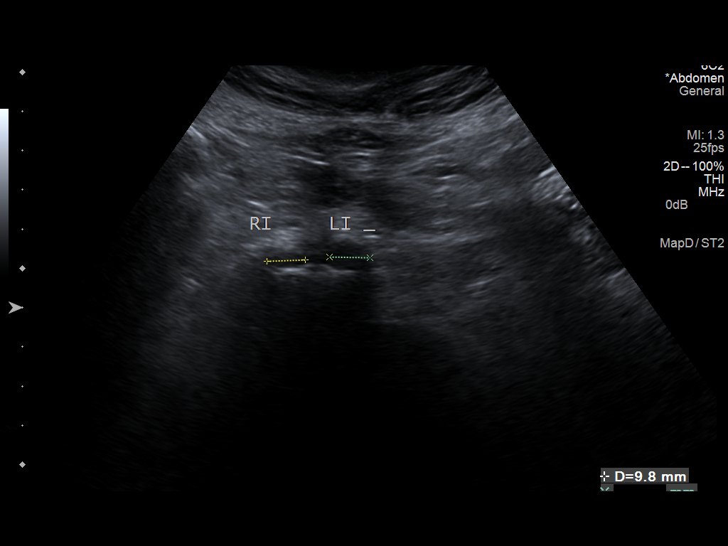
[im 9/10]
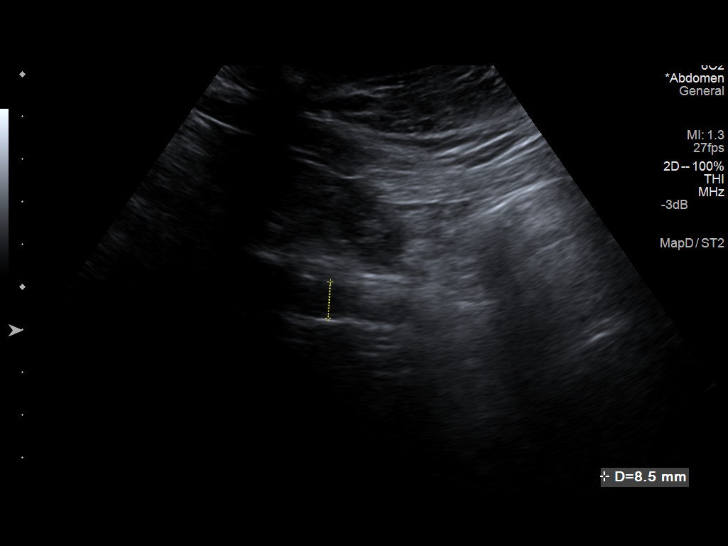
[im 10/10]
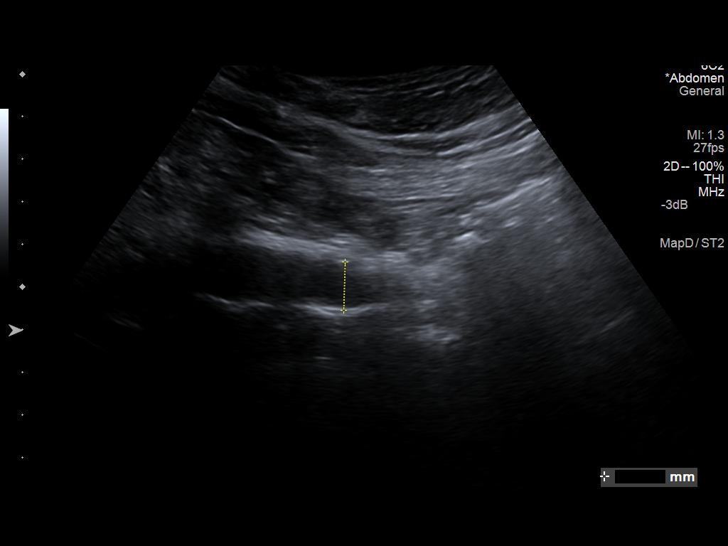

[10 of 10 positions shown; findings below may reference images not displayed]

FINDINGS: Abdominal Aorta

No aneurysm identified.

Maximum transverse diameter of the aorta 2.3 cm. Right common iliac
artery 0.9 cm. Left common iliac artery 1.1 cm.
IMPRESSION: Negative for abdominal aortic aneurysm.

## 2014-11-06 ENCOUNTER — Encounter: Payer: Self-pay | Admitting: Neurology

## 2014-11-12 ENCOUNTER — Encounter: Payer: Self-pay | Admitting: Neurology

## 2015-02-18 DEATH — deceased

## 2015-06-23 IMAGING — CR DG CHEST 1V PORT
1 series · 1 of 1 positions shown · non-contrast
Comparison: 05/31/2008

CLINICAL DATA: Generalized weakness and chest pain 1 day.

EXAM:
PORTABLE CHEST - 1 VIEW

[portable]
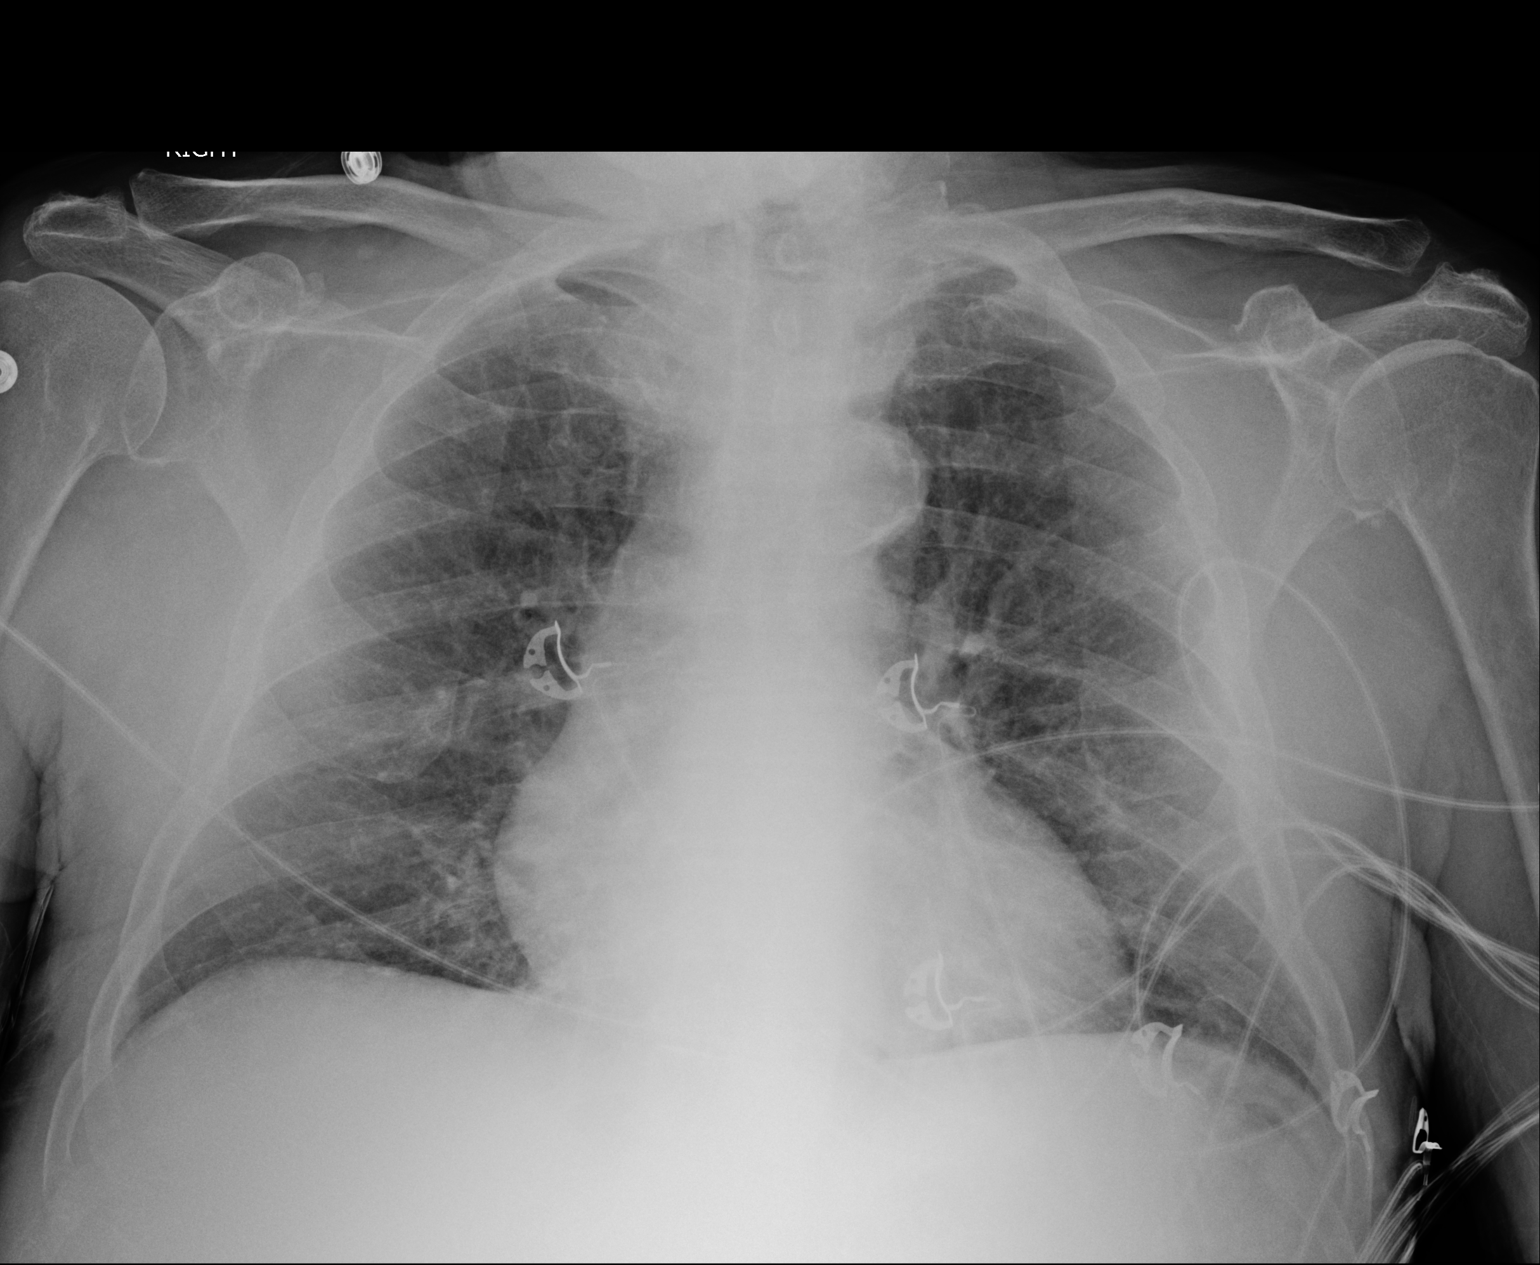

[1 of 1 positions shown; findings below may reference images not displayed]

FINDINGS: Somewhat lordotic positioning is demonstrated. Lungs are adequately
inflated without focal consolidation or effusion. Cardiomediastinal
silhouette is within normal. There is calcified plaque over the
aortic arch. Remainder the exam is unchanged.
IMPRESSION: No acute cardiopulmonary disease.
